# Patient Record
Sex: Male | Born: 1940 | Race: White | Hispanic: No | Marital: Married | Smoking: Current every day smoker
Health system: Southern US, Community
[De-identification: ages and names within clinical notes are randomized; demographics above are authoritative.]

## PROBLEM LIST (undated history)

## (undated) DIAGNOSIS — F419 Anxiety disorder, unspecified: Secondary | ICD-10-CM

## (undated) DIAGNOSIS — I509 Heart failure, unspecified: Secondary | ICD-10-CM

## (undated) DIAGNOSIS — E785 Hyperlipidemia, unspecified: Secondary | ICD-10-CM

## (undated) DIAGNOSIS — I10 Essential (primary) hypertension: Secondary | ICD-10-CM

## (undated) DIAGNOSIS — K219 Gastro-esophageal reflux disease without esophagitis: Secondary | ICD-10-CM

## (undated) DIAGNOSIS — F319 Bipolar disorder, unspecified: Secondary | ICD-10-CM

## (undated) DIAGNOSIS — F0390 Unspecified dementia without behavioral disturbance: Secondary | ICD-10-CM

## (undated) DIAGNOSIS — F32A Depression, unspecified: Secondary | ICD-10-CM

## (undated) DIAGNOSIS — R079 Chest pain, unspecified: Secondary | ICD-10-CM

## (undated) DIAGNOSIS — I739 Peripheral vascular disease, unspecified: Secondary | ICD-10-CM

## (undated) DIAGNOSIS — Z720 Tobacco use: Secondary | ICD-10-CM

## (undated) DIAGNOSIS — F329 Major depressive disorder, single episode, unspecified: Secondary | ICD-10-CM

## (undated) DIAGNOSIS — G20 Parkinson's disease: Secondary | ICD-10-CM

## (undated) HISTORY — PX: CAROTID STENT: SHX1301

---

## 2005-12-02 ENCOUNTER — Emergency Department (HOSPITAL_COMMUNITY): Admission: EM | Admit: 2005-12-02 | Discharge: 2005-12-02 | Payer: Self-pay | Admitting: Emergency Medicine

## 2006-12-26 ENCOUNTER — Emergency Department: Payer: Self-pay | Admitting: Unknown Physician Specialty

## 2016-05-11 ENCOUNTER — Observation Stay (HOSPITAL_COMMUNITY): Payer: Medicare Other

## 2016-05-11 ENCOUNTER — Inpatient Hospital Stay (HOSPITAL_COMMUNITY)
Admission: EM | Admit: 2016-05-11 | Discharge: 2016-05-13 | DRG: 313 | Disposition: A | Discharge status: Home Health | Payer: Medicare Other | Source: Other Acute Inpatient Hospital | Attending: Family Medicine | Admitting: Family Medicine

## 2016-05-11 ENCOUNTER — Observation Stay (HOSPITAL_BASED_OUTPATIENT_CLINIC_OR_DEPARTMENT_OTHER): Payer: Medicare Other

## 2016-05-11 ENCOUNTER — Encounter (HOSPITAL_COMMUNITY): Payer: Self-pay | Admitting: Family Medicine

## 2016-05-11 DIAGNOSIS — E876 Hypokalemia: Secondary | ICD-10-CM | POA: Diagnosis present

## 2016-05-11 DIAGNOSIS — G309 Alzheimer's disease, unspecified: Secondary | ICD-10-CM | POA: Diagnosis present

## 2016-05-11 DIAGNOSIS — F0280 Dementia in other diseases classified elsewhere without behavioral disturbance: Secondary | ICD-10-CM | POA: Diagnosis present

## 2016-05-11 DIAGNOSIS — F17210 Nicotine dependence, cigarettes, uncomplicated: Secondary | ICD-10-CM | POA: Diagnosis present

## 2016-05-11 DIAGNOSIS — F329 Major depressive disorder, single episode, unspecified: Secondary | ICD-10-CM | POA: Diagnosis present

## 2016-05-11 DIAGNOSIS — I509 Heart failure, unspecified: Secondary | ICD-10-CM | POA: Diagnosis present

## 2016-05-11 DIAGNOSIS — F419 Anxiety disorder, unspecified: Secondary | ICD-10-CM | POA: Diagnosis present

## 2016-05-11 DIAGNOSIS — F32A Depression, unspecified: Secondary | ICD-10-CM | POA: Diagnosis present

## 2016-05-11 DIAGNOSIS — F319 Bipolar disorder, unspecified: Secondary | ICD-10-CM | POA: Diagnosis present

## 2016-05-11 DIAGNOSIS — Z7902 Long term (current) use of antithrombotics/antiplatelets: Secondary | ICD-10-CM

## 2016-05-11 DIAGNOSIS — I110 Hypertensive heart disease with heart failure: Secondary | ICD-10-CM | POA: Diagnosis present

## 2016-05-11 DIAGNOSIS — Z888 Allergy status to other drugs, medicaments and biological substances status: Secondary | ICD-10-CM

## 2016-05-11 DIAGNOSIS — I447 Left bundle-branch block, unspecified: Secondary | ICD-10-CM | POA: Diagnosis present

## 2016-05-11 DIAGNOSIS — Z8249 Family history of ischemic heart disease and other diseases of the circulatory system: Secondary | ICD-10-CM

## 2016-05-11 DIAGNOSIS — F0390 Unspecified dementia without behavioral disturbance: Secondary | ICD-10-CM | POA: Diagnosis present

## 2016-05-11 DIAGNOSIS — G20 Parkinson's disease: Secondary | ICD-10-CM | POA: Diagnosis present

## 2016-05-11 DIAGNOSIS — I10 Essential (primary) hypertension: Secondary | ICD-10-CM | POA: Diagnosis present

## 2016-05-11 DIAGNOSIS — I519 Heart disease, unspecified: Secondary | ICD-10-CM | POA: Diagnosis present

## 2016-05-11 DIAGNOSIS — K219 Gastro-esophageal reflux disease without esophagitis: Secondary | ICD-10-CM | POA: Diagnosis present

## 2016-05-11 DIAGNOSIS — J449 Chronic obstructive pulmonary disease, unspecified: Secondary | ICD-10-CM | POA: Diagnosis present

## 2016-05-11 DIAGNOSIS — E785 Hyperlipidemia, unspecified: Secondary | ICD-10-CM | POA: Diagnosis present

## 2016-05-11 DIAGNOSIS — Z91018 Allergy to other foods: Secondary | ICD-10-CM

## 2016-05-11 DIAGNOSIS — R079 Chest pain, unspecified: Principal | ICD-10-CM | POA: Diagnosis present

## 2016-05-11 DIAGNOSIS — Z881 Allergy status to other antibiotic agents status: Secondary | ICD-10-CM

## 2016-05-11 DIAGNOSIS — I739 Peripheral vascular disease, unspecified: Secondary | ICD-10-CM | POA: Diagnosis present

## 2016-05-11 DIAGNOSIS — Z79899 Other long term (current) drug therapy: Secondary | ICD-10-CM

## 2016-05-11 DIAGNOSIS — I472 Ventricular tachycardia: Secondary | ICD-10-CM | POA: Diagnosis present

## 2016-05-11 DIAGNOSIS — I6529 Occlusion and stenosis of unspecified carotid artery: Secondary | ICD-10-CM | POA: Diagnosis present

## 2016-05-11 DIAGNOSIS — Z720 Tobacco use: Secondary | ICD-10-CM | POA: Diagnosis not present

## 2016-05-11 DIAGNOSIS — M6281 Muscle weakness (generalized): Secondary | ICD-10-CM

## 2016-05-11 HISTORY — DX: Major depressive disorder, single episode, unspecified: F32.9

## 2016-05-11 HISTORY — DX: Parkinson's disease: G20

## 2016-05-11 HISTORY — DX: Anxiety disorder, unspecified: F41.9

## 2016-05-11 HISTORY — DX: Chest pain, unspecified: R07.9

## 2016-05-11 HISTORY — DX: Gastro-esophageal reflux disease without esophagitis: K21.9

## 2016-05-11 HISTORY — DX: Heart failure, unspecified: I50.9

## 2016-05-11 HISTORY — DX: Peripheral vascular disease, unspecified: I73.9

## 2016-05-11 HISTORY — DX: Depression, unspecified: F32.A

## 2016-05-11 HISTORY — DX: Hyperlipidemia, unspecified: E78.5

## 2016-05-11 HISTORY — DX: Tobacco use: Z72.0

## 2016-05-11 HISTORY — DX: Bipolar disorder, unspecified: F31.9

## 2016-05-11 HISTORY — DX: Unspecified dementia, unspecified severity, without behavioral disturbance, psychotic disturbance, mood disturbance, and anxiety: F03.90

## 2016-05-11 HISTORY — DX: Essential (primary) hypertension: I10

## 2016-05-11 LAB — COMPREHENSIVE METABOLIC PANEL
ALBUMIN: 3.2 g/dL — AB (ref 3.5–5.0)
ALK PHOS: 45 U/L (ref 38–126)
ALT: 14 U/L — AB (ref 17–63)
AST: 12 U/L — AB (ref 15–41)
Anion gap: 8 (ref 5–15)
BILIRUBIN TOTAL: 0.5 mg/dL (ref 0.3–1.2)
BUN: 16 mg/dL (ref 6–20)
CALCIUM: 8.5 mg/dL — AB (ref 8.9–10.3)
CO2: 31 mmol/L (ref 22–32)
Chloride: 101 mmol/L (ref 101–111)
Creatinine, Ser: 0.82 mg/dL (ref 0.61–1.24)
GFR calc Af Amer: >60 mL/min (ref >60)
GFR calc non Af Amer: >60 mL/min (ref >60)
GLUCOSE: 93 mg/dL (ref 65–99)
Potassium: 3.3 mmol/L — ABNORMAL LOW (ref 3.5–5.1)
SODIUM: 140 mmol/L (ref 135–145)
TOTAL PROTEIN: 5.6 g/dL — AB (ref 6.5–8.1)

## 2016-05-11 LAB — CBC
HEMATOCRIT: 35.7 % — AB (ref 39.0–52.0)
HEMOGLOBIN: 11.8 g/dL — AB (ref 13.0–17.0)
MCH: 32.4 pg (ref 26.0–34.0)
MCHC: 33.1 g/dL (ref 30.0–36.0)
MCV: 98.1 fL (ref 78.0–100.0)
Platelets: 87 10*3/uL — ABNORMAL LOW (ref 150–400)
RBC: 3.64 MIL/uL — AB (ref 4.22–5.81)
RDW: 13 % (ref 11.5–15.5)
WBC: 3.8 10*3/uL — ABNORMAL LOW (ref 4.0–10.5)

## 2016-05-11 LAB — LIPID PANEL
CHOLESTEROL: 118 mg/dL (ref 0–200)
HDL: 42 mg/dL (ref >40)
LDL Cholesterol: 65 mg/dL (ref 0–99)
TRIGLYCERIDES: 57 mg/dL (ref <150)
Total CHOL/HDL Ratio: 2.8 RATIO
VLDL: 11 mg/dL (ref 0–40)

## 2016-05-11 LAB — ECHOCARDIOGRAM COMPLETE

## 2016-05-11 LAB — TROPONIN I
Troponin I: <0.03 ng/mL (ref <0.03)
Troponin I: <0.03 ng/mL (ref <0.03)

## 2016-05-11 LAB — TSH: TSH: 3.843 u[IU]/mL (ref 0.350–4.500)

## 2016-05-11 MED ORDER — IPRATROPIUM-ALBUTEROL 0.5-2.5 (3) MG/3ML IN SOLN
3.0000 mL | Freq: Once | RESPIRATORY_TRACT | Status: AC
  Administered 2016-05-11: 3 mL via RESPIRATORY_TRACT
  Filled 2016-05-11: qty 3

## 2016-05-11 MED ORDER — TIOTROPIUM BROMIDE MONOHYDRATE 18 MCG IN CAPS
18.0000 ug | ORAL_CAPSULE | Freq: Every day | RESPIRATORY_TRACT | Status: DC
  Administered 2016-05-12 – 2016-05-13 (×2): 18 ug via RESPIRATORY_TRACT
  Filled 2016-05-11: qty 5

## 2016-05-11 MED ORDER — CARBIDOPA-LEVODOPA ER 50-200 MG PO TBCR
1.0000 | EXTENDED_RELEASE_TABLET | Freq: Two times a day (BID) | ORAL | Status: DC
  Administered 2016-05-11 – 2016-05-13 (×5): 1 via ORAL
  Filled 2016-05-11 (×11): qty 1

## 2016-05-11 MED ORDER — ACETAMINOPHEN 325 MG PO TABS: 650.0000 mg | ORAL_TABLET | ORAL | Status: DC | PRN

## 2016-05-11 MED ORDER — ROSUVASTATIN CALCIUM 20 MG PO TABS
20.0000 mg | ORAL_TABLET | Freq: Every day | ORAL | Status: DC
  Administered 2016-05-11 – 2016-05-12 (×2): 20 mg via ORAL
  Filled 2016-05-11 (×2): qty 1

## 2016-05-11 MED ORDER — FINASTERIDE 5 MG PO TABS
5.0000 mg | ORAL_TABLET | Freq: Every day | ORAL | Status: DC
  Administered 2016-05-11 – 2016-05-13 (×3): 5 mg via ORAL
  Filled 2016-05-11 (×6): qty 1

## 2016-05-11 MED ORDER — ASPIRIN EC 325 MG PO TBEC
325.0000 mg | DELAYED_RELEASE_TABLET | Freq: Every day | ORAL | Status: DC
  Administered 2016-05-11 – 2016-05-13 (×3): 325 mg via ORAL
  Filled 2016-05-11 (×3): qty 1

## 2016-05-11 MED ORDER — ARFORMOTEROL TARTRATE 15 MCG/2ML IN NEBU
15.0000 ug | INHALATION_SOLUTION | Freq: Two times a day (BID) | RESPIRATORY_TRACT | Status: DC
  Administered 2016-05-12 – 2016-05-13 (×3): 15 ug via RESPIRATORY_TRACT
  Filled 2016-05-11 (×4): qty 2

## 2016-05-11 MED ORDER — POTASSIUM CHLORIDE CRYS ER 20 MEQ PO TBCR
40.0000 meq | EXTENDED_RELEASE_TABLET | Freq: Once | ORAL | Status: AC
  Administered 2016-05-11: 40 meq via ORAL
  Filled 2016-05-11: qty 2

## 2016-05-11 MED ORDER — TAMSULOSIN HCL 0.4 MG PO CAPS
0.4000 mg | ORAL_CAPSULE | Freq: Every day | ORAL | Status: DC
  Administered 2016-05-11 – 2016-05-13 (×3): 0.4 mg via ORAL
  Filled 2016-05-11 (×3): qty 1

## 2016-05-11 MED ORDER — MORPHINE SULFATE (PF) 2 MG/ML IV SOLN: 2.0000 mg | INTRAVENOUS | Status: DC | PRN

## 2016-05-11 MED ORDER — FUROSEMIDE 40 MG PO TABS
40.0000 mg | ORAL_TABLET | Freq: Two times a day (BID) | ORAL | Status: DC
  Administered 2016-05-11 – 2016-05-13 (×4): 40 mg via ORAL
  Filled 2016-05-11 (×4): qty 1

## 2016-05-11 MED ORDER — CLOPIDOGREL BISULFATE 75 MG PO TABS
75.0000 mg | ORAL_TABLET | Freq: Every day | ORAL | Status: DC
  Administered 2016-05-11 – 2016-05-13 (×3): 75 mg via ORAL
  Filled 2016-05-11 (×3): qty 1

## 2016-05-11 MED ORDER — VENLAFAXINE HCL ER 75 MG PO CP24
150.0000 mg | ORAL_CAPSULE | Freq: Every day | ORAL | Status: DC
  Administered 2016-05-11 – 2016-05-13 (×3): 150 mg via ORAL
  Filled 2016-05-11 (×3): qty 2

## 2016-05-11 MED ORDER — POTASSIUM CHLORIDE CRYS ER 20 MEQ PO TBCR
40.0000 meq | EXTENDED_RELEASE_TABLET | Freq: Every day | ORAL | Status: DC
  Administered 2016-05-11 – 2016-05-12 (×2): 40 meq via ORAL
  Filled 2016-05-11 (×2): qty 2

## 2016-05-11 MED ORDER — NITROGLYCERIN 0.3 MG SL SUBL
0.3000 mg | SUBLINGUAL_TABLET | SUBLINGUAL | Status: DC | PRN
  Filled 2016-05-11: qty 100

## 2016-05-11 MED ORDER — NITROGLYCERIN 0.4 MG SL SUBL: 0.4000 mg | SUBLINGUAL_TABLET | SUBLINGUAL | Status: DC | PRN

## 2016-05-11 MED ORDER — METOPROLOL TARTRATE 25 MG PO TABS
12.5000 mg | ORAL_TABLET | Freq: Two times a day (BID) | ORAL | Status: AC
  Administered 2016-05-11 – 2016-05-12 (×4): 12.5 mg via ORAL
  Filled 2016-05-11 (×4): qty 1

## 2016-05-11 MED ORDER — CLONAZEPAM 0.5 MG PO TABS
1.0000 mg | ORAL_TABLET | Freq: Three times a day (TID) | ORAL | Status: DC | PRN
  Administered 2016-05-11 – 2016-05-13 (×4): 1 mg via ORAL
  Filled 2016-05-11 (×4): qty 2

## 2016-05-11 MED ORDER — ONDANSETRON HCL 4 MG/2ML IJ SOLN: 4.0000 mg | Freq: Four times a day (QID) | INTRAMUSCULAR | Status: DC | PRN

## 2016-05-11 MED ORDER — NICOTINE 21 MG/24HR TD PT24
21.0000 mg | MEDICATED_PATCH | Freq: Every day | TRANSDERMAL | Status: DC
  Administered 2016-05-11 – 2016-05-13 (×3): 21 mg via TRANSDERMAL
  Filled 2016-05-11 (×3): qty 1

## 2016-05-11 MED ORDER — SUCRALFATE 1 G PO TABS
1.0000 g | ORAL_TABLET | Freq: Four times a day (QID) | ORAL | Status: DC
  Administered 2016-05-11 – 2016-05-13 (×8): 1 g via ORAL
  Filled 2016-05-11 (×8): qty 1

## 2016-05-11 MED ORDER — HEPARIN SODIUM (PORCINE) 5000 UNIT/ML IJ SOLN: 5000.0000 [IU] | Freq: Three times a day (TID) | INTRAMUSCULAR | Status: DC

## 2016-05-11 MED ORDER — VITAMIN C 500 MG PO TABS
250.0000 mg | ORAL_TABLET | Freq: Every day | ORAL | Status: DC
  Administered 2016-05-11 – 2016-05-13 (×3): 250 mg via ORAL
  Filled 2016-05-11 (×3): qty 1

## 2016-05-11 MED ORDER — GI COCKTAIL ~~LOC~~: 30.0000 mL | Freq: Four times a day (QID) | ORAL | Status: DC | PRN

## 2016-05-11 MED ORDER — GABAPENTIN 400 MG PO CAPS
400.0000 mg | ORAL_CAPSULE | Freq: Every day | ORAL | Status: DC
  Administered 2016-05-11 – 2016-05-12 (×2): 400 mg via ORAL
  Filled 2016-05-11 (×2): qty 1

## 2016-05-11 MED ORDER — FUROSEMIDE 10 MG/ML IJ SOLN
40.0000 mg | Freq: Once | INTRAMUSCULAR | Status: AC
  Administered 2016-05-11: 40 mg via INTRAVENOUS
  Filled 2016-05-11: qty 4

## 2016-05-11 MED ORDER — PANTOPRAZOLE SODIUM 40 MG PO TBEC
40.0000 mg | DELAYED_RELEASE_TABLET | Freq: Every day | ORAL | Status: DC
  Administered 2016-05-11 – 2016-05-13 (×2): 40 mg via ORAL
  Filled 2016-05-11 (×3): qty 1

## 2016-05-11 MED ORDER — OLANZAPINE 5 MG PO TABS
5.0000 mg | ORAL_TABLET | Freq: Every day | ORAL | Status: DC
  Administered 2016-05-11 – 2016-05-12 (×2): 5 mg via ORAL
  Filled 2016-05-11 (×2): qty 1

## 2016-05-11 MED ORDER — ALBUTEROL SULFATE (2.5 MG/3ML) 0.083% IN NEBU
2.5000 mg | INHALATION_SOLUTION | RESPIRATORY_TRACT | Status: DC | PRN
  Administered 2016-05-13: 2.5 mg via RESPIRATORY_TRACT
  Filled 2016-05-11: qty 3

## 2016-05-11 NOTE — Evaluation (Signed)
Physical Therapy Evaluation Patient Details Name: Shane Thornton MRN: [id number] DOB: August 16, 1940 Today's Date: 05/11/2016   History of Present Illness  HPI: Shane Thornton is a 76 y.o. male with known PVD s/p right carotid stent who was sent over from [facility] ER for admission for chest pain.  The patient reports that he has been having chest pain for past several weeks in intermittent bouts of pain and discomforts with some pain 9/10 but that has been rare.  He reports that he was seen a couple of weeks ago at [facility] for similar symptoms and apparently saw a cardiologist at that time but not able to give good history.  He says that he was placed on a cardiac monitor but did not have a stress test or cardiac cath.  Pt has no diaphoresis and no n/v/d.  He had a normal troponin and no acute findings on EKG reported from ED in [place]  Clinical Impression  Pt normally uses a quad cane at his house; states that he does not have room for a rolling walker.  Pt will be ambulated with a quad cane next session.    Follow Up Recommendations Outpatient PT (for balance and improved strength )    Equipment Recommendations  None recommended by PT    Recommendations for Other Services       Precautions / Restrictions Precautions Precautions: None Restrictions Weight Bearing Restrictions: No      Mobility  Bed Mobility Overal bed mobility: Modified Independent                Transfers Overall transfer level: Modified independent                  Ambulation/Gait Ambulation/Gait assistance: Modified independent (Device/Increase time) Ambulation Distance (Feet): 200 Feet Assistive device: Rolling walker (2 wheeled)          Stairs            Wheelchair Mobility    Modified Rankin (Stroke Patients Only)       Balance                                             Pertinent Vitals/Pain Pain Assessment: No/denies pain     Home Living Family/patient expects to be discharged to:: Private residence Living Arrangements: Alone Available Help at Discharge: Family;Available PRN/intermittently Type of Home: House Home Access: Stairs to enter Entrance Stairs-Rails: Right Entrance Stairs-Number of Steps: 3 Home Layout: One level Home Equipment: Cane - quad      Prior Function Level of Independence: Independent with assistive device(s)               Hand Dominance        Extremity/Trunk Assessment        Lower Extremity Assessment Lower Extremity Assessment: Generalized weakness       Communication   Communication: No difficulties  Cognition Arousal/Alertness: Awake/alert Behavior During Therapy: WFL for tasks assessed/performed Overall Cognitive Status: Within Functional Limits for tasks assessed                      General Comments      Exercises General Exercises - Lower Extremity Ankle Circles/Pumps: Both;5 reps Hip ABduction/ADduction: Both;5 reps;Sidelying Straight Leg Raises: Both;5 reps   Assessment/Plan    PT Assessment Patient needs continued PT services  PT Problem List Decreased strength;Decreased activity tolerance          PT Treatment Interventions Gait training;Therapeutic activities;Therapeutic exercise    PT Goals (Current goals can be found in the Care Plan section)  Acute Rehab PT Goals Patient Stated Goal: To go home PT Goal Formulation: With patient Time For Goal Achievement: 05/13/16 Potential to Achieve Goals: Good    Frequency Min 3X/week   Barriers to discharge        Co-evaluation               End of Session Equipment Utilized During Treatment: Gait belt Activity Tolerance: Patient tolerated treatment well Patient left: in bed      Functional Limitation: Mobility: Walking and moving around Mobility: Walking and Moving Around Current Status ([id number](G8978): At least 1 percent but less than 20 percent impaired, limited or  restricted Mobility: Walking and Moving Around Goal Status [phone](G8979): At least 1 percent but less than 20 percent impaired, limited or restricted Mobility: Walking and Moving Around Discharge Status [phone](G8980): At least 1 percent but less than 20 percent impaired, limited or restricted    Time: 1410-1444 PT Time Calculation (min) (ACUTE ONLY): 34 min   Charges:   PT Evaluation $PT Eval Low Complexity: 1 Procedure     PT G Codes:   PT G-Codes **NOT FOR INPATIENT CLASS** Functional Limitation: Mobility: Walking and moving around Mobility: Walking and Moving Around Current Status ([id number](G8978): At least 1 percent but less than 20 percent impaired, limited or restricted Mobility: Walking and Moving Around Goal Status [phone](G8979): At least 1 percent but less than 20 percent impaired, limited or restricted Mobility: Walking and Moving Around Discharge Status [phone](G8980): At least 1 percent but less than 20 percent impaired, limited or restricted    Virgina OrganCynthia Deveon Kisiel, PT CLT [phone] 05/11/2016, 2:44 PM

## 2016-05-11 NOTE — Consult Note (Signed)
Cardiology Consultation   Patient ID: JAHLEEL STROSCHEIN; [id number]; [birth year]   Admit date: 05/11/2016 Date of Consult: 05/11/2016  Referring MD: Dr. Laural Benes Cardiologist: Cardiologist in [place] but he can't remember the name Consulting Cardiologist: Dr. Tenny Craw  No care team member to display    Reason for Consultation: Chest pain   History of Present Illness: Shane Thornton is a 76 y.o. male with a hx of PVD status post right carotid stent who was sent over from [facility] emergency room for admission for chest pain because her hospital was full. Initial troponin was reported to be negative.   He had an admission at [facility] several weeks ago for COPD exacerbation but was also told he had an irregular heartbeat and was monitored. They told him they thought he had blockages but wanted to treat him medically because they didn't think he could hold still for any type of test because of his Parkinson's. He was supposed to follow-up but he refused to.  Most of the history is taken from his daughter who is at the bedside. Since his last hospitalization she says he hasn't done much of anything. His only gotten dressed twice. She took his blood pressure yesterday and it was over 200. He then complained of some chest pain so she took him to the ER. He describes his chest pain as a pressure or heaviness associated with dyspnea. It is worse with exertion but can occur at rest. No radiation or diaphoresis. The worst episode occurred several months ago when he was walking back to  his house after taking care of his hogs. It hurt the whole way back and didn't ease until he laid down. His father died at 57 of an MI, long history of smoking quit 2 or 3 weeks ago, hypertension. No diabetes question cholesterol.   Past Medical History:  Diagnosis Date  . Anxiety disorder   . Bipolar 1 disorder (HCC)   . Chest pain in adult 05/11/2016  . CHF (congestive heart failure) (HCC) 05/11/2016  .  Dementia   . Depression   . Dyslipidemia   . Essential hypertension   . GERD (gastroesophageal reflux disease)   . Parkinson disease (HCC)   . PVD (peripheral vascular disease) (HCC) 05/11/2016  . Tobacco abuse 05/11/2016    Past Surgical History:  Procedure Laterality Date  . CAROTID STENT Right       Home Meds: Prior to Admission medications   Medication Sig Start Date End Date Taking? Authorizing Provider  albuterol (PROVENTIL HFA;VENTOLIN HFA) 108 (90 Base) MCG/ACT inhaler Inhale 2 puffs into the lungs every 4 (four) hours as needed for wheezing or shortness of breath.   Yes Historical Provider, MD  albuterol (PROVENTIL) (2.5 MG/3ML) 0.083% nebulizer solution Take 2.5 mg by nebulization daily as needed for wheezing or shortness of breath.   Yes Historical Provider, MD  arformoterol (BROVANA) 15 MCG/2ML NEBU Take 15 mcg by nebulization 2 (two) times daily.   Yes Historical Provider, MD  carbidopa-levodopa (SINEMET CR) 50-200 MG tablet Take 1 tablet by mouth 2 (two) times daily.   Yes Historical Provider, MD  clonazePAM (KLONOPIN) 1 MG tablet Take 1 mg by mouth 4 (four) times daily.   Yes Historical Provider, MD  clopidogrel (PLAVIX) 75 MG tablet Take 75 mg by mouth daily.   Yes Historical Provider, MD  desvenlafaxine (PRISTIQ) 100 MG 24 hr tablet Take 100 mg by mouth daily.   Yes Historical Provider, MD  esomeprazole (NEXIUM) 40 MG  capsule Take 40 mg by mouth daily at 12 noon.   Yes Historical Provider, MD  finasteride (PROSCAR) 5 MG tablet Take 5 mg by mouth daily.   Yes Historical Provider, MD  furosemide (LASIX) 40 MG tablet Take 40 mg by mouth 2 (two) times daily.   Yes Historical Provider, MD  gabapentin (NEURONTIN) 400 MG capsule Take 400 mg by mouth at bedtime.   Yes Historical Provider, MD  metoprolol tartrate (LOPRESSOR) 25 MG tablet Take 12.5 mg by mouth 2 (two) times daily.   Yes Historical Provider, MD  Multiple Vitamins-Iron (MULTIVITAMIN/IRON PO) Take 1 tablet by mouth  daily.   Yes Historical Provider, MD  nicotine (NICODERM CQ - DOSED IN MG/24 HOURS) 21 mg/24hr patch Place 21 mg onto the skin daily.   Yes Historical Provider, MD  OLANZapine (ZYPREXA) 5 MG tablet Take 5 mg by mouth at bedtime.   Yes Historical Provider, MD  potassium chloride (K-DUR,KLOR-CON) 10 MEQ tablet Take 10 mEq by mouth daily.   Yes Historical Provider, MD  rosuvastatin (CRESTOR) 20 MG tablet Take 20 mg by mouth at bedtime.   Yes Historical Provider, MD  sildenafil (VIAGRA) 25 MG tablet Take 25 mg by mouth daily as needed for erectile dysfunction.   Yes Historical Provider, MD  sucralfate (CARAFATE) 1 g tablet Take 1 g by mouth 4 (four) times daily.   Yes Historical Provider, MD  tamsulosin (FLOMAX) 0.4 MG CAPS capsule Take 0.4 mg by mouth daily.   Yes Historical Provider, MD  tiotropium (SPIRIVA) 18 MCG inhalation capsule Place 18 mcg into inhaler and inhale daily.   Yes Historical Provider, MD  vitamin C (ASCORBIC ACID) 250 MG tablet Take 250 mg by mouth daily.   Yes Historical Provider, MD    Current Medications: . arformoterol  15 mcg Nebulization BID  . aspirin EC  325 mg Oral Daily  . carbidopa-levodopa  1 tablet Oral BID  . clopidogrel  75 mg Oral Daily  . finasteride  5 mg Oral Daily  . furosemide  40 mg Oral BID  . gabapentin  400 mg Oral QHS  . metoprolol tartrate  12.5 mg Oral BID  . nicotine  21 mg Transdermal Daily  . OLANZapine  5 mg Oral QHS  . pantoprazole  40 mg Oral Q0600  . potassium chloride  40 mEq Oral Once  . [START ON 05/12/2016] potassium chloride  40 mEq Oral Daily  . rosuvastatin  20 mg Oral QHS  . sucralfate  1 g Oral QID  . tamsulosin  0.4 mg Oral Daily  . tiotropium  18 mcg Inhalation Daily  . venlafaxine XR  150 mg Oral Q breakfast  . vitamin C  250 mg Oral Daily     Allergies:    Allergies  Allergen Reactions  . Avelox [Moxifloxacin Hcl In Nacl] Other (See Comments)    unknown  . Ciprofloxacin Other (See Comments)    depression  . Horse  Chestnut [Aesculus] Other (See Comments)    Childhood allergy.  . Metronidazole Other (See Comments)    unknown  . Papaya Derivatives Hives    Social History:   The patient  reports that he has been smoking Cigarettes.  He does not have any smokeless tobacco history on file. He reports that he drinks about 3.0 oz of alcohol per week . He reports that he does not use drugs.    Family History:   The patient's family history includes Heart attack in his father.   ROS:  Please see the history of present illness.  Review of Systems  Constitution: Positive for weakness and malaise/fatigue.  Cardiovascular: Positive for chest pain, dyspnea on exertion, irregular heartbeat and leg swelling.  Respiratory: Positive for cough, shortness of breath and wheezing.    All other ROS reviewed and negative.      Vital Signs: Blood pressure (!) 155/57, pulse (!) 57, temperature 98.2 F (36.8 C), temperature source Oral, resp. rate 20, SpO2 98 %.   PHYSICAL EXAM: General:  Well nourished, well developed, in no acute distress  HEENT: normal Lymph: no adenopathy Neck: no JVD Endocrine:  No thryomegaly Vascular: No carotid bruits; FA pulses 2+ bilaterally without bruits  Cardiac:  RRR; normal S1, S2; 2/6 systolic murmur at the left sternal border, no rub, bruit, thrill, or heave Lungs:  Decreased breath sounds with scattered rhonchi  Abd: soft, nontender, no hepatomegaly  Ext: no edema, Good distal pulses bilaterally Musculoskeletal:  No deformities, BUE and BLE strength normal and equal Skin: warm and dry  Neuro:  CNs 2-12 intact, no focal abnormalities noted Psych:  Normal affect    EKG:  Normal sinus rhythm LAFB, poor R-wave progression anteriorly  Telemetry: Sinus bradycardia  Labs: No results for input(s): CKTOTAL, CKMB, TROPONINI in the last 72 hours. Lab Results  Component Value Date   WBC 3.8 (L) 05/11/2016   HGB 11.8 (L) 05/11/2016   HCT 35.7 (L) 05/11/2016   MCV 98.1 05/11/2016     PLT 87 (L) 05/11/2016    Recent Labs Lab 05/11/16 0936  NA 140  K 3.3*  CL 101  CO2 31  BUN 16  CREATININE 0.82  CALCIUM 8.5*  PROT 5.6*  BILITOT 0.5  ALKPHOS 45  ALT 14*  AST 12*  GLUCOSE 93   Lab Results  Component Value Date   CHOL 118 05/11/2016   HDL 42 05/11/2016   LDLCALC 65 05/11/2016   TRIG 57 05/11/2016   No results found for: DDIMER  Radiology/Studies:  Dg Chest 2 View  Result Date: 05/11/2016 CLINICAL DATA:  Chest pain for several weeks.  Right carotid stent. EXAM: CHEST  2 VIEW COMPARISON:  None. FINDINGS: Biapical pleuroparenchymal scarring more prominent on the right but without a masslike or convex inferior appearance. Peripheral right mid lung 10 mm very dense pulmonary nodule, probably a calcified granuloma given the sharp borders. Thoracic spondylosis. Mild bony demineralization. Mild enlargement of the cardiopericardial silhouette. Faint interstitial accentuation bilaterally without overt edema. No pleural effusion. Atherosclerotic calcification of the descending thoracic aorta. IMPRESSION: 1. Mild enlargement of the cardiopericardial silhouette. Faint interstitial accentuation is of uncertain chronicity but could conceivably reflect pulmonary venous hypertension and mild interstitial edema. 2. Suspected old granulomatous disease with a 10 mm calcified nodule peripherally in the right middle lobe. 3. Biapical pleuroparenchymal scarring. 4. Thoracic spondylosis. 5. Atherosclerosis. Electronically Signed   By: Gaylyn Rong M.D.   On: 05/11/2016 10:36     PROBLEM LIST:  Active Problems:   Chest pain in adult   PVD (peripheral vascular disease) (HCC)   CHF (congestive heart failure) (HCC)   Tobacco abuse   Chest pain at rest   Parkinson disease (HCC)   GERD (gastroesophageal reflux disease)   Essential hypertension   Dyslipidemia   Depression   Dementia   Bipolar 1 disorder (HCC)   Anxiety disorder     ASSESSMENT AND PLAN:  Chest pain  worrisome for angina with multiple cardiac risk factors. Initial troponin reportedly negative at [facility] ER. On low-dose metoprolol  12.5 mg twice a day. Patient has sinus bradycardia so cannot titrate. Can proceed with Lexi scan Myoview tomorrow if troponins negative.  Hypertension elevated recently  COPD with recent exacerbation  CHF: No history of this. Await 2-D echo. Given IV Lasix and diuresing  PVD status post carotid stent on Plavix  Parkinson's disease  Bipolar disorder   Signed, Jacolyn ReedyMichele Lenze, PA-C  05/11/2016 12:55 PM   Patient seen and examined  I agree with findings as noted above by Leda GauzeM Lenze  Pt admitted for CP On exam, he is currently comfortable  Lungs with mild wheeze  Cardiac exam:  RRR  NO S3  Ext without edema I have reviewed echo he had done today  LVEF is 40 to 45% with hypokinesis of the inferior/inferoseptal walls   I discussed findingas with pt  Based on above I would recomm L heart cath the define anatomy  The patient says he is not ready for this, not sure he wants this I have also been able to talk to daughter Shane Thornton on the phone  Told her about recommendations  She will review with Dr Wyline MoodBranch and with Pt tomorrow. Would keep on current regimen for now.    Dietrich PatesPaula Peola Thornton

## 2016-05-11 NOTE — Progress Notes (Signed)
*  PRELIMINARY RESULTS* Echocardiogram 2D Echocardiogram has been performed.  Jeryl Columbialliott, Christee Mervine 05/11/2016, 11:28 AM

## 2016-05-11 NOTE — Care Management Note (Signed)
Case Management Note  Patient Details  Name: Delila SpenceHoward C Borrayo MRN: [id number] Date of Birth: 11/07/1940  Subjective/Objective:                  Patient adm from home with CP. He lives alone but daughter and son  both live in neighboring houses. He walks with a cane, but states he is unsteady and has lots of pain in feet. PT consult pending. He has home oxygen, he wears at night provided  By [employer]. He has a neb machine also.   Action/Plan: CM to follow for needs. If needs HH, patient want to use [facility].   Expected Discharge Date:    05/11/2016          Expected Discharge Plan:  Home w Home Health Services  In-House Referral:  NA  Discharge planning Services  CM Consult  Post Acute Care Choice:  Home Health Choice offered to:  Patient, Adult Children  DME Arranged:    DME Agency:     HH Arranged:    HH Agency:     Status of Service:  In process, will continue to follow  If discussed at Long Length of Stay Meetings, dates discussed:    Additional Comments:  Isabela Nardelli, Chrystine OilerSharley Diane, RN 05/11/2016, 11:09 AM

## 2016-05-11 NOTE — H&P (Signed)
History and Physical  Shane SpenceHoward C Thornton ZOX:[id number] DOB: 05/26/1940 DOA: 05/11/2016  PCP: Harland DingwallSettle  Chief Complaint: chest pain  HPI: Shane SpenceHoward C Thornton is a 76 y.o. male with known PVD s/p right carotid stent who was [facility] for [facility] for chest pain.  The patient reports that he has been having chest pain for past several weeks in intermittent bouts of pain and discomforts with some pain 9/10 but that has been rare.  He reports that he was seen a couple of weeks ago [facility] for similar symptoms and apparently saw a cardiologist at that time but not able to give good history.  He says that he was placed on a cardiac monitor but did not have a stress test or cardiac cath.  Pt has no diaphoresis and no n/v/d.  He had a normal troponin and no acute findings on EKG reported from ED in [place].    Review of Systems: All systems reviewed and apart from history of presenting illness, are negative.  Past Medical History:  Diagnosis Date  . Chest pain in adult 05/11/2016  . CHF (congestive heart failure) (HCC) 05/11/2016  . PVD (peripheral vascular disease) (HCC) 05/11/2016  . Tobacco abuse 05/11/2016   Past Surgical History:  Procedure Laterality Date  . CAROTID STENT Right    Social History:  reports that he has been smoking Cigarettes.  He does not have any smokeless tobacco history on file. He reports that he drinks about 3.0 oz of alcohol per week . He reports that he does not use drugs.  Family History: Denies history of premature cardiac death, heart attack, stroke.   Prior to Admission medications   Not on File   Physical Exam: Vitals: SEE EMR    General exam: Moderately built and nourished patient, lying comfortably in no obvious distress.  Head, eyes and ENT: Nontraumatic and normocephalic. Pupils equally reacting to light and accommodation. Oral mucosa dry.  Neck: Supple. No JVD, carotid bruit or thyromegaly.  Lymphatics: No  lymphadenopathy.  Respiratory system: Clear to auscultation. No increased work of breathing.  Cardiovascular system: S1 and S2 heard, RRR. No JVD, murmurs, gallops, clicks or pedal edema.  Gastrointestinal system: Abdomen is nondistended, soft and nontender. Normal bowel sounds heard. No organomegaly or masses appreciated.  Central nervous system: Alert and oriented. No focal neurological deficits.  Extremities: Symmetric 5 x 5 power. Peripheral pulses symmetrically felt.   Skin: No rashes or acute findings.  Musculoskeletal system: trace bilateral pretibial edema.  Psychiatry: Pleasant and cooperative.  Labs on Admission:  Basic Metabolic Panel: No results for input(s): NA, K, CL, CO2, GLUCOSE, BUN, CREATININE, CALCIUM, MG, PHOS in the last 168 hours. Liver Function Tests: No results for input(s): AST, ALT, ALKPHOS, BILITOT, PROT, ALBUMIN in the last 168 hours. No results for input(s): LIPASE, AMYLASE in the last 168 hours. No results for input(s): AMMONIA in the last 168 hours. CBC: No results for input(s): WBC, NEUTROABS, HGB, HCT, MCV, PLT in the last 168 hours. Cardiac Enzymes: No results for input(s): CKTOTAL, CKMB, CKMBINDEX, TROPONINI in the last 168 hours.  BNP (last 3 results) No results for input(s): PROBNP in the last 8760 hours. CBG: No results for input(s): GLUCAP in the last 168 hours.  Radiological Exams on Admission: No results found.  EKG: Independently reviewed.  Assessment/Plan Active Problems:   Chest pain in adult   PVD (peripheral vascular disease) (HCC)   CHF (congestive heart failure) (HCC)  Tobacco abuse   Chest pain at rest  Atypical presentation of chest pain in patient with risk factors and poor historian - admit for observation, monitor on telemetry, cycle troponin I, check lipids. Check Echo with reported history of CHF, ordered nuclear stress test. Counseled on tobacco cessation, repeat EKG for any recurrent chest pain, check vitals  regularly, ask pharmacy to reconcile home medications, morphine, NTG, aspirin and oxygen as needed ordered.   Pending further work up get cardiology involved as needed.    DVT Prophylaxis: heparin Code Status: full  Family Communication: daughter   Disposition Plan: Home    Time spent: 62 mins  Standley Dakins, MD [facility] Pager [phone]  If 7PM-7AM, please contact night-coverage [email or web address] Password [email or web address] 05/11/2016, 9:16 AM

## 2016-05-11 NOTE — Care Management Obs Status (Signed)
MEDICARE OBSERVATION STATUS NOTIFICATION   Patient Details  Name: Shane SpenceHoward C Voisin MRN: [id number] Date of Birth: 03/03/1941   Medicare Observation Status Notification Given:  Yes    Caelen Higinbotham, Chrystine OilerSharley Diane, RN 05/11/2016, 11:14 AM

## 2016-05-12 ENCOUNTER — Observation Stay (HOSPITAL_BASED_OUTPATIENT_CLINIC_OR_DEPARTMENT_OTHER): Payer: Medicare Other

## 2016-05-12 ENCOUNTER — Encounter (HOSPITAL_COMMUNITY): Payer: Self-pay

## 2016-05-12 DIAGNOSIS — F0280 Dementia in other diseases classified elsewhere without behavioral disturbance: Secondary | ICD-10-CM | POA: Diagnosis present

## 2016-05-12 DIAGNOSIS — I509 Heart failure, unspecified: Secondary | ICD-10-CM | POA: Diagnosis present

## 2016-05-12 DIAGNOSIS — F319 Bipolar disorder, unspecified: Secondary | ICD-10-CM | POA: Diagnosis present

## 2016-05-12 DIAGNOSIS — I447 Left bundle-branch block, unspecified: Secondary | ICD-10-CM | POA: Diagnosis present

## 2016-05-12 DIAGNOSIS — J449 Chronic obstructive pulmonary disease, unspecified: Secondary | ICD-10-CM | POA: Diagnosis present

## 2016-05-12 DIAGNOSIS — R079 Chest pain, unspecified: Secondary | ICD-10-CM

## 2016-05-12 DIAGNOSIS — F17210 Nicotine dependence, cigarettes, uncomplicated: Secondary | ICD-10-CM | POA: Diagnosis present

## 2016-05-12 DIAGNOSIS — R0789 Other chest pain: Secondary | ICD-10-CM | POA: Diagnosis not present

## 2016-05-12 DIAGNOSIS — G20 Parkinson's disease: Secondary | ICD-10-CM | POA: Diagnosis present

## 2016-05-12 DIAGNOSIS — Z7902 Long term (current) use of antithrombotics/antiplatelets: Secondary | ICD-10-CM | POA: Diagnosis not present

## 2016-05-12 DIAGNOSIS — E785 Hyperlipidemia, unspecified: Secondary | ICD-10-CM

## 2016-05-12 DIAGNOSIS — I739 Peripheral vascular disease, unspecified: Secondary | ICD-10-CM | POA: Diagnosis present

## 2016-05-12 DIAGNOSIS — I472 Ventricular tachycardia: Secondary | ICD-10-CM | POA: Diagnosis present

## 2016-05-12 DIAGNOSIS — I5022 Chronic systolic (congestive) heart failure: Secondary | ICD-10-CM | POA: Diagnosis not present

## 2016-05-12 DIAGNOSIS — Z8249 Family history of ischemic heart disease and other diseases of the circulatory system: Secondary | ICD-10-CM | POA: Diagnosis not present

## 2016-05-12 DIAGNOSIS — I6529 Occlusion and stenosis of unspecified carotid artery: Secondary | ICD-10-CM | POA: Diagnosis present

## 2016-05-12 DIAGNOSIS — E876 Hypokalemia: Secondary | ICD-10-CM | POA: Diagnosis present

## 2016-05-12 DIAGNOSIS — I519 Heart disease, unspecified: Secondary | ICD-10-CM | POA: Diagnosis present

## 2016-05-12 DIAGNOSIS — Z79899 Other long term (current) drug therapy: Secondary | ICD-10-CM | POA: Diagnosis not present

## 2016-05-12 DIAGNOSIS — F419 Anxiety disorder, unspecified: Secondary | ICD-10-CM | POA: Diagnosis present

## 2016-05-12 DIAGNOSIS — Z91018 Allergy to other foods: Secondary | ICD-10-CM | POA: Diagnosis not present

## 2016-05-12 DIAGNOSIS — K219 Gastro-esophageal reflux disease without esophagitis: Secondary | ICD-10-CM

## 2016-05-12 DIAGNOSIS — Z888 Allergy status to other drugs, medicaments and biological substances status: Secondary | ICD-10-CM | POA: Diagnosis not present

## 2016-05-12 DIAGNOSIS — Z881 Allergy status to other antibiotic agents status: Secondary | ICD-10-CM | POA: Diagnosis not present

## 2016-05-12 DIAGNOSIS — I110 Hypertensive heart disease with heart failure: Secondary | ICD-10-CM | POA: Diagnosis present

## 2016-05-12 DIAGNOSIS — G309 Alzheimer's disease, unspecified: Secondary | ICD-10-CM | POA: Diagnosis present

## 2016-05-12 DIAGNOSIS — I10 Essential (primary) hypertension: Secondary | ICD-10-CM | POA: Diagnosis not present

## 2016-05-12 LAB — BASIC METABOLIC PANEL
Anion gap: 6 (ref 5–15)
BUN: 16 mg/dL (ref 6–20)
CALCIUM: 8.9 mg/dL (ref 8.9–10.3)
CHLORIDE: 104 mmol/L (ref 101–111)
CO2: 30 mmol/L (ref 22–32)
CREATININE: 0.91 mg/dL (ref 0.61–1.24)
GFR calc non Af Amer: >60 mL/min (ref >60)
GLUCOSE: 104 mg/dL — AB (ref 65–99)
Potassium: 3.1 mmol/L — ABNORMAL LOW (ref 3.5–5.1)
Sodium: 140 mmol/L (ref 135–145)

## 2016-05-12 LAB — NM MYOCAR MULTI W/SPECT W/WALL MOTION / EF
CHL CUP NUCLEAR SDS: 2
CHL CUP NUCLEAR SSS: 8
CSEPPHR: 76 {beats}/min
LHR: 0.1
LV dias vol: 130 mL (ref 62–150)
LV sys vol: 73 mL
Rest HR: 60 {beats}/min
SRS: 6
TID: 1.06

## 2016-05-12 LAB — HEMOGLOBIN A1C
HEMOGLOBIN A1C: 5.5 % (ref 4.8–5.6)
Mean Plasma Glucose: 111 mg/dL

## 2016-05-12 LAB — MAGNESIUM: Magnesium: 1.8 mg/dL (ref 1.7–2.4)

## 2016-05-12 MED ORDER — POTASSIUM CHLORIDE CRYS ER 20 MEQ PO TBCR
40.0000 meq | EXTENDED_RELEASE_TABLET | Freq: Two times a day (BID) | ORAL | Status: DC
  Administered 2016-05-12 – 2016-05-13 (×2): 40 meq via ORAL
  Filled 2016-05-12 (×2): qty 2

## 2016-05-12 MED ORDER — POTASSIUM CHLORIDE CRYS ER 20 MEQ PO TBCR
20.0000 meq | EXTENDED_RELEASE_TABLET | Freq: Once | ORAL | Status: AC
  Administered 2016-05-12: 20 meq via ORAL
  Filled 2016-05-12: qty 1

## 2016-05-12 MED ORDER — REGADENOSON 0.4 MG/5ML IV SOLN
0.4000 mg | Freq: Once | INTRAVENOUS | Status: AC
  Administered 2016-05-12: 0.4 mg via INTRAVENOUS
  Filled 2016-05-12: qty 5

## 2016-05-12 MED ORDER — ISOSORBIDE MONONITRATE ER 30 MG PO TB24
15.0000 mg | ORAL_TABLET | Freq: Every day | ORAL | Status: DC
  Administered 2016-05-12 – 2016-05-13 (×2): 15 mg via ORAL
  Filled 2016-05-12 (×2): qty 1

## 2016-05-12 MED ORDER — METOPROLOL SUCCINATE ER 25 MG PO TB24
12.5000 mg | ORAL_TABLET | Freq: Every day | ORAL | Status: DC
  Administered 2016-05-13: 12.5 mg via ORAL
  Filled 2016-05-12: qty 1

## 2016-05-12 MED ORDER — TECHNETIUM TC 99M TETROFOSMIN IV KIT
30.0000 | PACK | Freq: Once | INTRAVENOUS | Status: AC | PRN
  Administered 2016-05-12: 29.7 via INTRAVENOUS

## 2016-05-12 MED ORDER — REGADENOSON 0.4 MG/5ML IV SOLN
INTRAVENOUS | Status: AC
  Administered 2016-05-12: 0.4 mg via INTRAVENOUS
  Filled 2016-05-12: qty 5

## 2016-05-12 MED ORDER — TECHNETIUM TC 99M TETROFOSMIN IV KIT
10.0000 | PACK | Freq: Once | INTRAVENOUS | Status: AC | PRN
Start: 2016-05-12 — End: 2016-05-12
  Administered 2016-05-12: 10 via INTRAVENOUS

## 2016-05-12 MED ORDER — SODIUM CHLORIDE 0.9% FLUSH
INTRAVENOUS | Status: AC
  Administered 2016-05-12: 10 mL via INTRAVENOUS
  Filled 2016-05-12: qty 10

## 2016-05-12 MED ORDER — LISINOPRIL 5 MG PO TABS
2.5000 mg | ORAL_TABLET | Freq: Every day | ORAL | Status: DC
  Administered 2016-05-12 – 2016-05-13 (×2): 2.5 mg via ORAL
  Filled 2016-05-12 (×2): qty 1

## 2016-05-12 MED ORDER — SODIUM CHLORIDE 0.9% FLUSH
INTRAVENOUS | Status: AC
  Filled 2016-05-12: qty 150

## 2016-05-12 NOTE — Progress Notes (Signed)
Patient Name: Shane Thornton C Slauson Date of Encounter: 05/12/2016  [facility]  [facility] Problem List     Active Problems:   Chest pain in adult   PVD (peripheral vascular disease) (HCC)   CHF (congestive heart failure) (HCC)   Tobacco abuse   Chest pain at rest   Parkinson disease (HCC)   GERD (gastroesophageal reflux disease)   Essential hypertension   Dyslipidemia   Depression   Dementia   Bipolar 1 disorder (HCC)   Anxiety disorder     Subjective   Some chest discomfort overnight. Vague description. Patient examined in the stress lab prior to Lexiscan stress test. Procedure explained to daughter by NM prior to test. Agreed to proceed.   Inpatient Medications    Scheduled Meds: . sodium chloride flush      . arformoterol  15 mcg Nebulization BID  . aspirin EC  325 mg Oral Daily  . carbidopa-levodopa  1 tablet Oral BID  . clopidogrel  75 mg Oral Daily  . finasteride  5 mg Oral Daily  . furosemide  40 mg Oral BID  . gabapentin  400 mg Oral QHS  . metoprolol tartrate  12.5 mg Oral BID  . nicotine  21 mg Transdermal Daily  . OLANZapine  5 mg Oral QHS  . pantoprazole  40 mg Oral Q0600  . potassium chloride  40 mEq Oral Daily  . rosuvastatin  20 mg Oral QHS  . sucralfate  1 g Oral QID  . tamsulosin  0.4 mg Oral Daily  . tiotropium  18 mcg Inhalation Daily  . venlafaxine XR  150 mg Oral Q breakfast  . vitamin C  250 mg Oral Daily   Continuous Infusions:  PRN Meds: acetaminophen, albuterol, clonazePAM, gi cocktail, morphine injection, nitroGLYCERIN, ondansetron (ZOFRAN) IV, technetium tetrofosmin   Vital Signs    Vitals:   05/11/16 2203 05/12/16 0640 05/12/16 0802 05/12/16 0813  BP: 125/89 (!) 137/53    Pulse: 66 (!) 57    Resp: 20 20    Temp: 97.8 F (36.6 C) 97.4 F (36.3 C)    TempSrc: Oral Oral    SpO2: 94% 95% 93% 98%  Height:        Intake/Output Summary (Last 24 hours) at 05/12/16 1007 Last data filed at 05/12/16 0641  Gross per  24 hour  Intake              480 ml  Output             2700 ml  Net            -2220 ml   There were no vitals filed for this visit.  Physical Exam    GEN: Well nourished, well developed, in no acute distress.  HEENT: Grossly normal.  Neck: Supple, no JVD, carotid bruits, or masses. Cardiac: RRR, occasional irregular systole, 1/6 systolic murmurs, rubs, or gallops. No clubbing, cyanosis, edema.  Radials/DP/PT 2+ and equal bilaterally.  Respiratory:  Mild crackles bilaterally, no wheezes or rhonchi. Non-labored breathing.  GI: Soft, nontender, nondistended, BS + x 4. MS: no deformity or atrophy. Skin: warm and dry, no rash. Neuro:  Strength and sensation are intact. Psych: AAOx3.  Flat affect.   Labs    CBC  Recent Labs  05/11/16 0936  WBC 3.8*  HGB 11.8*  HCT 35.7*  MCV 98.1  PLT 87*   Basic Metabolic Panel  Recent Labs  05/11/16 0936  NA 140  K 3.3*  CL  101  CO2 31  GLUCOSE 93  BUN 16  CREATININE 0.82  CALCIUM 8.5*   Liver Function Tests  Recent Labs  05/11/16 0936  AST 12*  ALT 14*  ALKPHOS 45  BILITOT 0.5  PROT 5.6*  ALBUMIN 3.2*   Cardiac Enzymes  Recent Labs  05/11/16 1600 05/11/16 2118  TROPONINI <0.03 <0.03   Hemoglobin A1C  Recent Labs  05/11/16 0936  HGBA1C 5.5   Fasting Lipid Panel  Recent Labs  05/11/16 0936  CHOL 118  HDL 42  LDLCALC 65  TRIG 57  CHOLHDL 2.8   Thyroid Function Tests  Recent Labs  05/11/16 0936  TSH 3.843    Telemetry    LBBB, sinus bradycardia- Personally Reviewed    Radiology    Dg Chest 2 View  Result Date: 05/11/2016 CLINICAL DATA:  Chest pain for several weeks.  Right carotid stent. EXAM: CHEST  2 VIEW COMPARISON:  None. FINDINGS: Biapical pleuroparenchymal scarring more prominent on the right but without a masslike or convex inferior appearance. Peripheral right mid lung 10 mm very dense pulmonary nodule, probably a calcified granuloma given the sharp borders. Thoracic spondylosis.  Mild bony demineralization. Mild enlargement of the cardiopericardial silhouette. Faint interstitial accentuation bilaterally without overt edema. No pleural effusion. Atherosclerotic calcification of the descending thoracic aorta. IMPRESSION: 1. Mild enlargement of the cardiopericardial silhouette. Faint interstitial accentuation is of uncertain chronicity but could conceivably reflect pulmonary venous hypertension and mild interstitial edema. 2. Suspected old granulomatous disease with a 10 mm calcified nodule peripherally in the right middle lobe. 3. Biapical pleuroparenchymal scarring. 4. Thoracic spondylosis. 5. Atherosclerosis. Electronically Signed   By: Gaylyn Rong M.D.   On: 05/11/2016 10:36    Cardiac Studies   Echocardiogram 05/11/2016 Study Conclusions  - Left ventricle: LVEF is approximately 40 to 45% with hypokinesis   of the inferior/inferoseptal walls . The cavity size was   moderately dilated. Wall thickness was increased in a pattern of   mild LVH. Doppler parameters are consistent with abnormal left   ventricular relaxation (grade 1 diastolic dysfunction). Doppler   parameters are consistent with high ventricular filling pressure. - Mitral valve: There was mild regurgitation. - Left atrium: The atrium was mildly dilated. - Right ventricle: Systolic function was mildly reduced. - Right atrium: The atrium was mildly dilated. - Pulmonary arteries: PA peak pressure: 39 mm Hg (S).  Patient Profile      76 y.o. male with a hx of PVD status post right carotid stent who was sent over from [facility] emergency room for admission for chest pain, recent treatment for COPD exacerbation at [facility]. He has hx of Bipolar disorder and is a poor historian. Daughter provides information and consent.  Assessment & Plan    1. Chest Pain: Troponin negative X 3. Lexiscan stress test this am. Scintigraphy is pending. He continues to have complaints of chest  pressure. EKG LBBB with bradycardia at baseline prior to stress test. Continue metoprolol. Dr. Tenny Craw discussed possible cardiac cath with patient. He refuses at this time.   2. Carotid Artery Disease: On DAPT with ASA and Plavix.   3. Systolic Dysfunction: EF of 40-45%. Uncertain if this is new finding. Ischemic work up is underway. Consider adding low dose ACE. Creatinine 0.82. BP is stable. Has been given IV lasix. Diuresed 2.2 liters. No significant edema. Some crackles noted but may be from COPD. No wheezing or coughing.   4. COPD: Management per PCP.  Signed, Joni Reining, NP  05/12/2016, 10:07 AM   Patient seen and discussed with NP Lyman Bishop, I agree with her documenation above. Admitted with chest pain. Troponins neg x 2, EKG with LBBB of unknown chronicity. Echo LVEF 40-45%, hypokinesis inferior/inferoseptal walls. Nuclear stress suggests possible prior inferoapical infarct, no significant current ischemia. At this time would recommend medical therapy, would not pursue cath. From Dr Tenny Craw' note she discussed possible cath and patient was not in favor. If refractory symptoms could consider cath. Borderline low heart rates, will not titrate beta blocker. Will start low dose imdur 15mg  as additional antianginal. In setting of PAD and systolic dysfunction, start low dose lisionrpil 2.5mg . Change lopressor to Toprol XL 12.5mg  daily starting tomrrow. Patient with some mild chest pain today, follow symptoms today, possible discharge tomorrow.    Dominga Ferry MD

## 2016-05-12 NOTE — Progress Notes (Signed)
At 2109 CCMD called for the second time about pt running vtach/vfib on the telemetry monitor. Upon assessment pt denies any chest pain or discomfort. Vital signs were stable and MD made aware.

## 2016-05-12 NOTE — Evaluation (Signed)
Occupational Therapy Evaluation Patient Details Name: Shane Thornton MRN: [id number] DOB: 09/13/1940 Today's Date: 05/12/2016    History of Present Illness HPI: Shane Thornton is a 76 y.o. male with known PVD s/p right carotid stent who was [facility] for [facility] for chest pain.  The patient reports that he has been having chest pain for past several weeks in intermittent bouts of pain and discomforts with some pain 9/10 but that has been rare.  He reports that he was seen a couple of weeks ago [facility] for similar symptoms and apparently saw a cardiologist at that time but not able to give good history.  He says that he was placed on a cardiac monitor but did not have a stress test or cardiac cath.  Pt has no diaphoresis and no n/v/d.  He had a normal troponin and no acute findings on EKG [place]   Clinical Impression   Pt awake, alert, oriented x4 this am, agreeable to OT evaluation. Pt daughter reports he has not been up much at home and has not been completing ADLs over past week since admission to [facility]. Pt demonstrates baseline functioning with ADL completion, supervision for standing tasks. Pt daughter reports she has noticed a significant progression of Parkinson's symptoms over the past week of limited mobility at home. Discussed various AE to assist in feeding tasks due to pt tremors. Recommend outpatient OT services with emphasis on Parkinson's recovery program ([facility] or alternative neuro-based program). Provided pt and daughter with information on adaptive utensils. No further acute OT services required at this time.     Follow Up Recommendations  Outpatient OT    Equipment Recommendations  None recommended by OT       Precautions / Restrictions Precautions Precautions: None Restrictions Weight Bearing Restrictions: No      Mobility Bed Mobility Overal bed mobility:  Modified Independent                Transfers Overall transfer level: Modified independent                         ADL Overall ADL's : Needs assistance/impaired     Grooming: Wash/dry hands;Oral care;Supervision/safety;Standing               Lower Body Dressing: Modified independent;Sitting/lateral leans   Toilet Transfer: Modified Independent   Toileting- Clothing Manipulation and Hygiene: Modified independent;Sit to/from stand       Functional mobility during ADLs: Supervision/safety;Cane       Vision Vision Assessment?: No apparent visual deficits          Pertinent Vitals/Pain Pain Assessment: No/denies pain     Hand Dominance Right   Extremity/Trunk Assessment Upper Extremity Assessment Upper Extremity Assessment: Overall WFL for tasks assessed   Lower Extremity Assessment Lower Extremity Assessment: Defer to PT evaluation       Communication Communication Communication: No difficulties   Cognition Arousal/Alertness: Awake/alert Behavior During Therapy: WFL for tasks assessed/performed Overall Cognitive Status: Within Functional Limits for tasks assessed                                Home Living Family/patient expects to be discharged to:: Private residence Living Arrangements: Alone Available Help at Discharge: Family;Available PRN/intermittently Type of Home: House  Bathroom Shower/Tub: Tub/shower unit;Walk-in Human resources officer: Standard     Home Equipment: Cane - quad;Shower seat          Prior Functioning/Environment Level of Independence: Independent with assistive device(s)        Comments: Quad cane during ambulation        OT Problem List: Decreased activity tolerance;Decreased coordination;Decreased strength    End of Session Equipment Utilized During Treatment: Gait belt (quad cane)  Activity Tolerance: Patient tolerated treatment well Patient left: in bed;with  call bell/phone within reach;with family/visitor present   Time: 4540-9811 OT Time Calculation (min): 26 min Charges:  OT General Charges $OT Visit: 1 Procedure OT Evaluation $OT Eval Moderate Complexity: 1 Procedure G-Codes: OT G-codes **NOT FOR INPATIENT CLASS** Functional Assessment Tool Used: clinical judgement Functional Limitation: Self care Self Care Current Status (B1478): At least 20 percent but less than 40 percent impaired, limited or restricted Self Care Goal Status (G9562): At least 20 percent but less than 40 percent impaired, limited or restricted Self Care Discharge Status [phone]): At least 20 percent but less than 40 percent impaired, limited or restricted   Ezra Sites, OTR/L  [phone] 05/12/2016, 11:24 AM

## 2016-05-12 NOTE — Progress Notes (Addendum)
PROGRESS NOTE    Shane Thornton  ZOX:[id number]  DOB: 08/05/1940  DOA: 05/11/2016 PCP: No primary care provider on file. Outpatient Specialists:   Hospital course: Shane Thornton is a 76 y.o. male with known PVD s/p right carotid stent who was sent [facility] for [facility] for chest pain.  The patient reports that he has been having chest pain for past several weeks in intermittent bouts of pain and discomforts with some pain 9/10 but that has been rare.  He reports that he was seen a couple of weeks ago [facility] for similar [facility] and apparently saw a cardiologist at that time but not able to give good history.  He says that he was placed on a cardiac monitor but did not have a stress test or cardiac cath.  Pt has no diaphoresis and no n/v/d.  He had a normal troponin and no acute findings on EKG reported from [place].    Assessment & Plan:   1. Chest pain - still having some chest pain, Troponin has been negative.  EKG with LBBB of unknown chronicity. Echo LVEF 40-45%, hypokinesis inferior/inferoseptal walls. Nuclear stress suggests possible prior inferoapical infarct, no significant current ischemia. Cardiology is recommending medical management.  Medication adjustments being made.  Cardiology added imdur, lisinopril and toprol XL.  Will need to monitor with these changes.  Not medically ready for discharge today.   2. PVD - continue plavix and aspirin for now.  3. Tobacco Abuse - counseled at bedside on cessation.   4. CHF -stable, compensated. 5. Hypokalemia - replace orally, recheck BMP in AM.   DVT prophylaxis: heparin Code Status: full Family Communication: daughter Disposition Plan: Home with outpatient PT/OT recommended  Consultants:  cardiology  Procedures:  Stress test  Subjective: Pt having some chest discomfort but overall some improvement from admission  Objective: Vitals:   05/12/16 0640 05/12/16 0802 05/12/16  0813 05/12/16 1221  BP: (!) 137/53   (!) 137/49  Pulse: (!) 57   65  Resp: 20     Temp: 97.4 F (36.3 C)     TempSrc: Oral     SpO2: 95% 93% 98%   Height:        Intake/Output Summary (Last 24 hours) at 05/12/16 1425 Last data filed at 05/12/16 0641  Gross per 24 hour  Intake              480 ml  Output             1675 ml  Net            -1195 ml   There were no vitals filed for this visit.  Exam:  GEN: Well nourished, well developed, in no acute distress.  HEENT: Grossly normal.  Neck: Supple, no JVD, carotid bruits, or masses. Cardiac: RRR, occasional irregular systole, No clubbing, cyanosis, edema.    Respiratory:  Mild crackles bilaterally, no wheezes or rhonchi. Non-labored breathing.  GI: Soft, nontender, nondistended, BS + x 4. MS: no deformity or atrophy. Skin: warm and dry, no rash. Neuro:  Strength and sensation are intact. Psych: AAOx3.  Flat affect.   Data Reviewed: Basic Metabolic Panel:  Recent Labs Lab 05/11/16 0936 05/12/16 1016  NA 140 140  K 3.3* 3.1*  CL 101 104  CO2 31 30  GLUCOSE 93 104*  BUN 16 16  CREATININE 0.82 0.91  CALCIUM 8.5* 8.9   Liver Function Tests:  Recent Labs Lab  05/11/16 0936  AST 12*  ALT 14*  ALKPHOS 45  BILITOT 0.5  PROT 5.6*  ALBUMIN 3.2*   No results for input(s): LIPASE, AMYLASE in the last 168 hours. No results for input(s): AMMONIA in the last 168 hours. CBC:  Recent Labs Lab 05/11/16 0936  WBC 3.8*  HGB 11.8*  HCT 35.7*  MCV 98.1  PLT 87*   Cardiac Enzymes:  Recent Labs Lab 05/11/16 1600 05/11/16 2118  TROPONINI <0.03 <0.03   CBG (last 3)  No results for input(s): GLUCAP in the last 72 hours. No results found for this or any previous visit (from the past 240 hour(s)).   Studies: Nm Myocar Multi W/spect W/wall Motion / Ef  Result Date: 05/12/2016  There was no ST segment deviation noted during stress.  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  This is an intermediate risk study. Intermediate risk based on mildly decreased LVEF and prior infarct. There is no significant current ischemia.Consider correlating findings with echo.  The left ventricular ejection fraction is mildly decreased (45-54%).    Dg Chest 2 View  Result Date: 05/11/2016 CLINICAL DATA:  Chest pain for several weeks.  Right carotid stent. EXAM: CHEST  2 VIEW COMPARISON:  None. FINDINGS: Biapical pleuroparenchymal scarring more prominent on the right but without a masslike or convex inferior appearance. Peripheral right mid lung 10 mm very dense pulmonary nodule, probably a calcified granuloma given the sharp borders. Thoracic spondylosis. Mild bony demineralization. Mild enlargement of the cardiopericardial silhouette. Faint interstitial accentuation bilaterally without overt edema. No pleural effusion. Atherosclerotic calcification of the descending thoracic aorta. IMPRESSION: 1. Mild enlargement of the cardiopericardial silhouette. Faint interstitial accentuation is of uncertain chronicity but could conceivably reflect pulmonary venous hypertension and mild interstitial edema. 2. Suspected old granulomatous disease with a 10 mm calcified nodule peripherally in the right middle lobe. 3. Biapical pleuroparenchymal scarring. 4. Thoracic spondylosis. 5. Atherosclerosis. Electronically Signed   By: Gaylyn Rong M.D.   On: 05/11/2016 10:36   Scheduled Meds: . arformoterol  15 mcg Nebulization BID  . aspirin EC  325 mg Oral Daily  . carbidopa-levodopa  1 tablet Oral BID  . clopidogrel  75 mg Oral Daily  . finasteride  5 mg Oral Daily  . furosemide  40 mg Oral BID  . gabapentin  400 mg Oral QHS  . isosorbide mononitrate  15 mg Oral Daily  . lisinopril  2.5 mg Oral Daily  . [START ON 05/13/2016] metoprolol succinate  12.5 mg Oral Daily  . metoprolol tartrate  12.5 mg Oral BID  . nicotine  21 mg Transdermal Daily  . OLANZapine  5 mg Oral QHS  . pantoprazole   40 mg Oral Q0600  . potassium chloride  20 mEq Oral Once  . potassium chloride  40 mEq Oral Daily  . rosuvastatin  20 mg Oral QHS  . sodium chloride flush      . sucralfate  1 g Oral QID  . tamsulosin  0.4 mg Oral Daily  . tiotropium  18 mcg Inhalation Daily  . venlafaxine XR  150 mg Oral Q breakfast  . vitamin C  250 mg Oral Daily   Continuous Infusions:  Active Problems:   Chest pain in adult   PVD (peripheral vascular disease) (HCC)   CHF (congestive heart failure) (HCC)   Tobacco abuse   Chest pain at rest   Parkinson disease (HCC)   GERD (gastroesophageal reflux disease)   Essential hypertension  Dyslipidemia   Depression   Dementia   Bipolar 1 disorder (HCC)   Anxiety disorder   Time spent:   Standley Dakins, MD, FAAFP [facility] Pager [phone]  If 7PM-7AM, please contact night-coverage [email or web address] Password [email or web address] 05/12/2016, 2:25 PM    LOS: 0 days

## 2016-05-13 DIAGNOSIS — R0789 Other chest pain: Secondary | ICD-10-CM

## 2016-05-13 LAB — BASIC METABOLIC PANEL
Anion gap: 7 (ref 5–15)
BUN: 18 mg/dL (ref 6–20)
CALCIUM: 9.1 mg/dL (ref 8.9–10.3)
CO2: 30 mmol/L (ref 22–32)
CREATININE: 0.91 mg/dL (ref 0.61–1.24)
Chloride: 103 mmol/L (ref 101–111)
Glucose, Bld: 105 mg/dL — ABNORMAL HIGH (ref 65–99)
Potassium: 3.7 mmol/L (ref 3.5–5.1)
SODIUM: 140 mmol/L (ref 135–145)

## 2016-05-13 LAB — CBC
HCT: 38.8 % — ABNORMAL LOW (ref 39.0–52.0)
Hemoglobin: 12.8 g/dL — ABNORMAL LOW (ref 13.0–17.0)
MCH: 32.3 pg (ref 26.0–34.0)
MCHC: 33 g/dL (ref 30.0–36.0)
MCV: 98 fL (ref 78.0–100.0)
RBC: 3.96 MIL/uL — ABNORMAL LOW (ref 4.22–5.81)
RDW: 13.1 % (ref 11.5–15.5)
WBC: 5 10*3/uL (ref 4.0–10.5)

## 2016-05-13 LAB — MAGNESIUM: MAGNESIUM: 1.9 mg/dL (ref 1.7–2.4)

## 2016-05-13 MED ORDER — METOPROLOL SUCCINATE ER 25 MG PO TB24: 12.5000 mg | ORAL_TABLET | Freq: Every day | ORAL | 0 refills | Status: DC

## 2016-05-13 MED ORDER — ISOSORBIDE MONONITRATE ER 60 MG PO TB24: 30.0000 mg | ORAL_TABLET | Freq: Every day | ORAL | Status: DC

## 2016-05-13 MED ORDER — CLONAZEPAM 1 MG PO TABS: 1.0000 mg | ORAL_TABLET | Freq: Three times a day (TID) | ORAL | 0 refills | Status: AC | PRN

## 2016-05-13 MED ORDER — ASPIRIN 325 MG PO TBEC: 325.0000 mg | DELAYED_RELEASE_TABLET | Freq: Every day | ORAL | 0 refills | Status: DC

## 2016-05-13 MED ORDER — ISOSORBIDE MONONITRATE ER 30 MG PO TB24: 30.0000 mg | ORAL_TABLET | Freq: Every day | ORAL | 0 refills | Status: DC

## 2016-05-13 MED ORDER — ISOSORBIDE MONONITRATE ER 30 MG PO TB24
15.0000 mg | ORAL_TABLET | Freq: Once | ORAL | Status: AC
  Administered 2016-05-13: 15 mg via ORAL
  Filled 2016-05-13: qty 1

## 2016-05-13 MED ORDER — LISINOPRIL 2.5 MG PO TABS: 2.5000 mg | ORAL_TABLET | Freq: Every day | ORAL | 0 refills | Status: DC

## 2016-05-13 MED ORDER — POLYETHYLENE GLYCOL 3350 17 G PO PACK
34.0000 g | PACK | Freq: Two times a day (BID) | ORAL | Status: DC
  Administered 2016-05-13: 34 g via ORAL
  Filled 2016-05-13: qty 2

## 2016-05-13 MED ORDER — POTASSIUM CHLORIDE CRYS ER 20 MEQ PO TBCR: 20.0000 meq | EXTENDED_RELEASE_TABLET | Freq: Every day | ORAL | 0 refills | Status: DC

## 2016-05-13 NOTE — Progress Notes (Signed)
Patient with orders to be discharge home. Discharge instructions given, patient and family verbalized understanding. Patient stable. Prescriptions given. Patient left in private vehicle with family.

## 2016-05-13 NOTE — Care Management Important Message (Signed)
Important Message  Patient Details  Name: Shane SpenceHoward C Thornton MRN: [id number] Date of Birth: 08/14/1940   Medicare Important Message Given:  Yes    Malcolm MetroChildress, Sayer Masini Demske, RN 05/13/2016, 11:04 AM

## 2016-05-13 NOTE — Discharge Summary (Signed)
Physician Discharge Summary  Shane Thornton NFA:[id number] DOB: [birth year] DOA: 05/11/2016  PCP: No primary care provider on file.  Admit date: 05/11/2016 Discharge date: 05/13/2016  Admitted From: Home  Disposition:  Home with Home health services  Recommendations for Outpatient Follow-up:  1. Follow up with PCP in 1 weeks 2. Follow up with cardiologist in 1 week as scheduled 3. Please obtain BMP/CBC in one week  Discharge Condition: STABLE  CODE STATUS: FULL    Brief/Interim Summary: HPI: Shane Thornton is a 76 y.o. male with known PVD s/p right carotid stent who was sent over from [facility] ER for admission for chest pain.  The patient reports that he has been having chest pain for past several weeks in intermittent bouts of pain and discomforts with some pain 9/10 but that has been rare.  He reports that he was seen a couple of weeks ago at [facility] for similar symptoms and apparently saw a cardiologist at that time but not able to give good history.  He says that he was placed on a cardiac monitor but did not have a stress test or cardiac cath.  Pt has no diaphoresis and no n/v/d.  He had a normal troponin and no acute findings on EKG reported from ED in [place].    1. Chest pain - negative workup for ACS. EKG with chronic LBBB. Echo with LVEF 40-45%, inferior/inferoseptal hypokinesis. Nuclear stress with evidence of prior inferior scar, no significant ischemia - started on imdur 15mg  as additional antianginal. No titrate of beta blocker due to low HRs at times.  - increase imdur to 30mg  today. Reasonable for discharge from cardiac standpoint. We will arrange cardiology outpatient f/u in 2 weeks.  - if refractory symptoms, may need to consider cath as outpatient.   2. Systolic dysfunction - LVEF 40-45% by echo - started on Toprol XL 12.5mg  and lisinopril 2.5mg  daily. - continue current meds  3. NSVT - occasional short runs of NSVT. The reported episode of  VT last night around 9pm is artifact, the native rhythm can be seen marching through the artifact rhythm - continue beta blocker.   4. Carotid stenosis - on DAPT for history of prior carotid stent.   5.  PVD - continue plavix and aspirin for now  6.  Tobacco abuse - counseled on cessation  7.  Hypokalemia - repleted  8. Parkinsonism - Follow up with neurologist recommendations.    Discharge Diagnoses:  Active Problems:   Chest pain in adult   PVD (peripheral vascular disease) (HCC)   CHF (congestive heart failure) (HCC)   Tobacco abuse   Chest pain at rest   Parkinson disease (HCC)   GERD (gastroesophageal reflux disease)   Essential hypertension   Dyslipidemia   Depression   Dementia   Bipolar 1 disorder (HCC)   Anxiety disorder Discharge Instructions  Discharge Instructions    Discharge instructions    Complete by:  As directed    Do not take Viagra, you cannot take this with your new heart medications.     Allergies as of 05/13/2016      Reactions   Avelox [moxifloxacin Hcl In Nacl] Other (See Comments)   unknown   Ciprofloxacin Other (See Comments)   depression   Horse Chestnut [aesculus] Other (See Comments)   Childhood allergy.   Metronidazole Other (See Comments)   unknown   Papaya Derivatives Hives      Medication List    STOP taking these medications  metoprolol tartrate 25 MG tablet Commonly known as:  LOPRESSOR   sildenafil 25 MG tablet Commonly known as:  VIAGRA     TAKE these medications   albuterol (2.5 MG/3ML) 0.083% nebulizer solution Commonly known as:  PROVENTIL Take 2.5 mg by nebulization daily as needed for wheezing or shortness of breath.   albuterol 108 (90 Base) MCG/ACT inhaler Commonly known as:  PROVENTIL HFA;VENTOLIN HFA Inhale 2 puffs into the lungs every 4 (four) hours as needed for wheezing or shortness of breath.   arformoterol 15 MCG/2ML Nebu Commonly known as:  BROVANA Take 15 mcg by nebulization 2 (two) times  daily.   aspirin 325 MG EC tablet Take 1 tablet (325 mg total) by mouth daily. Start taking on:  05/14/2016   carbidopa-levodopa 50-200 MG tablet Commonly known as:  SINEMET CR Take 1 tablet by mouth 2 (two) times daily.   clonazePAM 1 MG tablet Commonly known as:  KLONOPIN Take 1 tablet (1 mg total) by mouth 3 (three) times daily as needed for anxiety. What changed:  when to take this  reasons to take this   clopidogrel 75 MG tablet Commonly known as:  PLAVIX Take 75 mg by mouth daily.   desvenlafaxine 100 MG 24 hr tablet Commonly known as:  PRISTIQ Take 100 mg by mouth daily.   esomeprazole 40 MG capsule Commonly known as:  NEXIUM Take 40 mg by mouth daily at 12 noon.   finasteride 5 MG tablet Commonly known as:  PROSCAR Take 5 mg by mouth daily.   furosemide 40 MG tablet Commonly known as:  LASIX Take 40 mg by mouth 2 (two) times daily.   gabapentin 400 MG capsule Commonly known as:  NEURONTIN Take 400 mg by mouth at bedtime.   isosorbide mononitrate 30 MG 24 hr tablet Commonly known as:  IMDUR Take 1 tablet (30 mg total) by mouth daily. Start taking on:  05/14/2016   lisinopril 2.5 MG tablet Commonly known as:  PRINIVIL,ZESTRIL Take 1 tablet (2.5 mg total) by mouth daily. Start taking on:  05/14/2016   metoprolol succinate 25 MG 24 hr tablet Commonly known as:  TOPROL-XL Take 0.5 tablets (12.5 mg total) by mouth daily. Start taking on:  05/14/2016   MULTIVITAMIN/IRON PO Take 1 tablet by mouth daily.   nicotine 21 mg/24hr patch Commonly known as:  NICODERM CQ - dosed in mg/24 hours Place 21 mg onto the skin daily.   nicotine polacrilex 4 MG lozenge Commonly known as:  COMMIT Take 4 mg by mouth as needed for smoking cessation.   OLANZapine 5 MG tablet Commonly known as:  ZYPREXA Take 5 mg by mouth at bedtime.   potassium chloride SA 20 MEQ tablet Commonly known as:  K-DUR,KLOR-CON Take 1 tablet (20 mEq total) by mouth daily. What  changed:  medication strength  how much to take   rosuvastatin 20 MG tablet Commonly known as:  CRESTOR Take 20 mg by mouth at bedtime.   sucralfate 1 g tablet Commonly known as:  CARAFATE Take 1 g by mouth 4 (four) times daily.   tamsulosin 0.4 MG Caps capsule Commonly known as:  FLOMAX Take 0.4 mg by mouth daily.   tiotropium 18 MCG inhalation capsule Commonly known as:  SPIRIVA Place 18 mcg into inhaler and inhale daily.   vitamin C 250 MG tablet Commonly known as:  ASCORBIC ACID Take 250 mg by mouth daily.      Follow-up Information    [facility].  Specialty:  Home Health Services Contact information: [address] [place] [address] [phone]        Joni Reining, NP Follow up on 05/27/2016.   Specialties:  Nurse Practitioner, Radiology, Cardiology Why:  at 1:30 pm Contact information: [address] [place] [address] [phone]        Primary care provider. Schedule an appointment as soon as possible for a visit in 1 week(s).          Allergies  Allergen Reactions  . Avelox [Moxifloxacin Hcl In Nacl] Other (See Comments)    unknown  . Ciprofloxacin Other (See Comments)    depression  . Horse Chestnut [Aesculus] Other (See Comments)    Childhood allergy.  . Metronidazole Other (See Comments)    unknown  . Papaya Derivatives Hives    Procedures/Studies: Nm Myocar Multi W/spect W/wall Motion / Ef  Result Date: 05/12/2016  There was no ST segment deviation noted during stress.  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  This is an intermediate risk study. Intermediate risk based on mildly decreased LVEF and prior infarct. There is no significant current ischemia.Consider correlating findings with echo.  The left ventricular ejection fraction is mildly decreased (45-54%).    Dg Chest 2 View  Result Date: 05/11/2016 CLINICAL DATA:  Chest pain for  several weeks.  Right carotid stent. EXAM: CHEST  2 VIEW COMPARISON:  None. FINDINGS: Biapical pleuroparenchymal scarring more prominent on the right but without a masslike or convex inferior appearance. Peripheral right mid lung 10 mm very dense pulmonary nodule, probably a calcified granuloma given the sharp borders. Thoracic spondylosis. Mild bony demineralization. Mild enlargement of the cardiopericardial silhouette. Faint interstitial accentuation bilaterally without overt edema. No pleural effusion. Atherosclerotic calcification of the descending thoracic aorta. IMPRESSION: 1. Mild enlargement of the cardiopericardial silhouette. Faint interstitial accentuation is of uncertain chronicity but could conceivably reflect pulmonary venous hypertension and mild interstitial edema. 2. Suspected old granulomatous disease with a 10 mm calcified nodule peripherally in the right middle lobe. 3. Biapical pleuroparenchymal scarring. 4. Thoracic spondylosis. 5. Atherosclerosis. Electronically Signed   By: Gaylyn Rong M.D.   On: 05/11/2016 10:36    (Echo, Carotid, EGD, Colonoscopy, ERCP)    Subjective: Pt reports that overall improved.    Discharge Exam: Vitals:   05/12/16 2123 05/13/16 0533  BP: 132/63 (!) 114/44  Pulse: 72 (!) 51  Resp:  20  Temp:  98.4 F (36.9 C)   Vitals:   05/13/16 0533 05/13/16 0741 05/13/16 0745 05/13/16 0746  BP: (!) 114/44     Pulse: (!) 51     Resp: 20     Temp: 98.4 F (36.9 C)     TempSrc: Oral     SpO2: 98% 93% 93% 93%  Height:       General: Parkinsonism tremor. Pt is alert, awake, not in acute distress Cardiovascular: RRR, S1/S2 +, no rubs, no gallops Respiratory: CTA bilaterally, no wheezing, no rhonchi Abdominal: Soft, NT, ND, bowel sounds + Extremities: no edema, no cyanosis  The results of significant diagnostics from this hospitalization (including imaging, microbiology, ancillary and laboratory) are listed below for reference.      Microbiology: No results found for this or any previous visit (from the past 240 hour(s)).   Labs: BNP (last 3 results) No results for input(s): BNP in the last 8760 hours. Basic Metabolic Panel:  Recent Labs Lab 05/11/16 0936 05/12/16 1016 05/13/16 0533  NA 140 140  140  K 3.3* 3.1* 3.7  CL 101 104 103  CO2 31 30 30   GLUCOSE 93 104* 105*  BUN 16 16 18   CREATININE 0.82 0.91 0.91  CALCIUM 8.5* 8.9 9.1  MG  --  1.8 1.9   Liver Function Tests:  Recent Labs Lab 05/11/16 0936  AST 12*  ALT 14*  ALKPHOS 45  BILITOT 0.5  PROT 5.6*  ALBUMIN 3.2*   No results for input(s): LIPASE, AMYLASE in the last 168 hours. No results for input(s): AMMONIA in the last 168 hours. CBC:  Recent Labs Lab 05/11/16 0936 05/13/16 0533  WBC 3.8* 5.0  HGB 11.8* 12.8*  HCT 35.7* 38.8*  MCV 98.1 98.0  PLT 87* SPECIMEN CHECKED FOR CLOTS   Cardiac Enzymes:  Recent Labs Lab 05/11/16 1600 05/11/16 2118  TROPONINI <0.03 <0.03   BNP: Invalid input(s): POCBNP CBG: No results for input(s): GLUCAP in the last 168 hours. D-Dimer No results for input(s): DDIMER in the last 72 hours. Hgb A1c  Recent Labs  05/11/16 0936  HGBA1C 5.5   Lipid Profile  Recent Labs  05/11/16 0936  CHOL 118  HDL 42  LDLCALC 65  TRIG 57  CHOLHDL 2.8   Thyroid function studies  Recent Labs  05/11/16 0936  TSH 3.843   Anemia work up No results for input(s): VITAMINB12, FOLATE, FERRITIN, TIBC, IRON, RETICCTPCT in the last 72 hours. Urinalysis No results found for: COLORURINE, APPEARANCEUR, LABSPEC, PHURINE, GLUCOSEU, HGBUR, BILIRUBINUR, KETONESUR, PROTEINUR, UROBILINOGEN, NITRITE, LEUKOCYTESUR Sepsis Labs Invalid input(s): PROCALCITONIN,  WBC,  LACTICIDVEN Microbiology No results found for this or any previous visit (from the past 240 hour(s)).   Time coordinating discharge: 31 minutes  SIGNED:   Standley Dakins, MD  [facility] 05/13/2016, 1:52 PM Pager   If 7PM-7AM,  please contact night-coverage [email or web address] Password [email or web address]

## 2016-05-13 NOTE — Progress Notes (Signed)
Primary cardiologist:  Subjective:    Abdominal pain this AM.   Objective:   Temp:  [97.6 F (36.4 C)-99.2 F (37.3 C)] 98.4 F (36.9 C) (01/05 0533) Pulse Rate:  [51-72] 51 (01/05 0533) Resp:  [20] 20 (01/05 0533) BP: (114-137)/(44-65) 114/44 (01/05 0533) SpO2:  [93 %-98 %] 93 % (01/05 0746) Last BM Date: 05/10/16  There were no vitals filed for this visit.  Intake/Output Summary (Last 24 hours) at 05/13/16 1005 Last data filed at 05/13/16 0934  Gross per 24 hour  Intake              240 ml  Output             1350 ml  Net            -1110 ml    Telemetry:SR, short runs of NSVT  Exam:  General: NAD  HEENT: sclera clear, throat clear  Resp: CTAB  Cardiac: RRR, no m/r/g, no jvd  GI:  Abdomen soft, NT, ND  MSK:no LE edema.   Neuro: no focal deficits  Psych: blunt affect  Lab Results:  Basic Metabolic Panel:  Recent Labs Lab 05/11/16 0936 05/12/16 1016 05/13/16 0533  NA 140 140 140  K 3.3* 3.1* 3.7  CL 101 104 103  CO2 31 30 30   GLUCOSE 93 104* 105*  BUN 16 16 18   CREATININE 0.82 0.91 0.91  CALCIUM 8.5* 8.9 9.1  MG  --  1.8 1.9    Liver Function Tests:  Recent Labs Lab 05/11/16 0936  AST 12*  ALT 14*  ALKPHOS 45  BILITOT 0.5  PROT 5.6*  ALBUMIN 3.2*    CBC:  Recent Labs Lab 05/11/16 0936 05/13/16 0533  WBC 3.8* 5.0  HGB 11.8* 12.8*  HCT 35.7* 38.8*  MCV 98.1 98.0  PLT 87* SPECIMEN CHECKED FOR CLOTS    Cardiac Enzymes:  Recent Labs Lab 05/11/16 1600 05/11/16 2118  TROPONINI <0.03 <0.03    BNP: No results for input(s): PROBNP in the last 8760 hours.  Coagulation: No results for input(s): INR in the last 168 hours.  ECG:   Medications:   Scheduled Medications: . arformoterol  15 mcg Nebulization BID  . aspirin EC  325 mg Oral Daily  . carbidopa-levodopa  1 tablet Oral BID  . clopidogrel  75 mg Oral Daily  . finasteride  5 mg Oral Daily  . furosemide  40 mg Oral BID  . gabapentin  400 mg Oral QHS  .  isosorbide mononitrate  15 mg Oral Daily  . lisinopril  2.5 mg Oral Daily  . metoprolol succinate  12.5 mg Oral Daily  . nicotine  21 mg Transdermal Daily  . OLANZapine  5 mg Oral QHS  . pantoprazole  40 mg Oral Q0600  . potassium chloride  40 mEq Oral BID  . rosuvastatin  20 mg Oral QHS  . sucralfate  1 g Oral QID  . tamsulosin  0.4 mg Oral Daily  . tiotropium  18 mcg Inhalation Daily  . venlafaxine XR  150 mg Oral Q breakfast  . vitamin C  250 mg Oral Daily     Infusions:   PRN Medications:  acetaminophen, albuterol, clonazePAM, gi cocktail, morphine injection, nitroGLYCERIN, ondansetron (ZOFRAN) IV     Assessment/Plan   1. Chest pain - negative workup for ACS. EKG with chronic LBBB. Echo with LVEF 40-45%, inferior/inferoseptal hypokinesis. Nuclear stress with evidence of prior inferior scar, no significant ischemia - started on imdur  15mg  as additional antianginal. No titrate of beta blocker due to low HRs at times.  - increase imdur to 30mg  today. Reasonable for discharge from cardiac standpoint. We will arrange outpatient f/u in 2 weeks.  - if refractory symptoms, may need to consider cath as outpatient.   2. Systolic dysfunction - LVEF 40-45% by echo - started on Toprol XL 12.5mg  and lisinopril 2.5mg  daily. - continue current meds  3. NSVT - occasional short runs of NSVT. The reported episode of VT last night around 9pm is artifact, the native rhythm can be seen marching through the artifact rhythm - continue beta blocker.   4. Carotid stenosis - on DAPT for history of prior carotid stent.     Dina Rich, M.D.

## 2016-05-13 NOTE — Discharge Instructions (Signed)
Do not take viagra, you can't take this with your new heart medications.    Fall Prevention in the Home Introduction Falls can cause injuries. They can happen to people of all ages. There are many things you can do to make your home safe and to help prevent falls. What can I do on the outside of my home?  Regularly fix the edges of walkways and driveways and fix any cracks.  Remove anything that might make you trip as you walk through a door, such as a raised step or threshold.  Trim any bushes or trees on the path to your home.  Use bright outdoor lighting.  Clear any walking paths of anything that might make someone trip, such as rocks or tools.  Regularly check to see if handrails are loose or broken. Make sure that both sides of any steps have handrails.  Any raised decks and porches should have guardrails on the edges.  Have any leaves, snow, or ice cleared regularly.  Use sand or salt on walking paths during winter.  Clean up any spills in your garage right away. This includes oil or grease spills. What can I do in the bathroom?  Use night lights.  Install grab bars by the toilet and in the tub and shower. Do not use towel bars as grab bars.  Use non-skid mats or decals in the tub or shower.  If you need to sit down in the shower, use a plastic, non-slip stool.  Keep the floor dry. Clean up any water that spills on the floor as soon as it happens.  Remove soap buildup in the tub or shower regularly.  Attach bath mats securely with double-sided non-slip rug tape.  Do not have throw rugs and other things on the floor that can make you trip. What can I do in the bedroom?  Use night lights.  Make sure that you have a light by your bed that is easy to reach.  Do not use any sheets or blankets that are too big for your bed. They should not hang down onto the floor.  Have a firm chair that has side arms. You can use this for support while you get dressed.  Do not  have throw rugs and other things on the floor that can make you trip. What can I do in the kitchen?  Clean up any spills right away.  Avoid walking on wet floors.  Keep items that you use a lot in easy-to-reach places.  If you need to reach something above you, use a strong step stool that has a grab bar.  Keep electrical cords out of the way.  Do not use floor polish or wax that makes floors slippery. If you must use wax, use non-skid floor wax.  Do not have throw rugs and other things on the floor that can make you trip. What can I do with my stairs?  Do not leave any items on the stairs.  Make sure that there are handrails on both sides of the stairs and use them. Fix handrails that are broken or loose. Make sure that handrails are as long as the stairways.  Check any carpeting to make sure that it is firmly attached to the stairs. Fix any carpet that is loose or worn.  Avoid having throw rugs at the top or bottom of the stairs. If you do have throw rugs, attach them to the floor with carpet tape.  Make sure that you have a  light switch at the top of the stairs and the bottom of the stairs. If you do not have them, ask someone to add them for you. What else can I do to help prevent falls?  Wear shoes that:  Do not have high heels.  Have rubber bottoms.  Are comfortable and fit you well.  Are closed at the toe. Do not wear sandals.  If you use a stepladder:  Make sure that it is fully opened. Do not climb a closed stepladder.  Make sure that both sides of the stepladder are locked into place.  Ask someone to hold it for you, if possible.  Clearly mark and make sure that you can see:  Any grab bars or handrails.  First and last steps.  Where the edge of each step is.  Use tools that help you move around (mobility aids) if they are needed. These include:  Canes.  Walkers.  Scooters.  Crutches.  Turn on the lights when you go into a dark area. Replace  any light bulbs as soon as they burn out.  Set up your furniture so you have a clear path. Avoid moving your furniture around.  If any of your floors are uneven, fix them.  If there are any pets around you, be aware of where they are.  Review your medicines with your doctor. Some medicines can make you feel dizzy. This can increase your chance of falling. Ask your doctor what other things that you can do to help prevent falls. This information is not intended to replace advice given to you by your health care provider. Make sure you discuss any questions you have with your health care provider. Document Released: 02/19/2009 Document Revised: 10/01/2015 Document Reviewed: 05/30/2014  2017 Elsevier    Chest Wall Pain Chest wall pain is pain in or around the bones and muscles of your chest. Sometimes, an injury causes this pain. Sometimes, the cause may not be known. This pain may take several weeks or longer to get better. Follow these instructions at home: Pay attention to any changes in your symptoms. Take these actions to help with your pain:  Rest as told by your health care provider.  Avoid activities that cause pain. These include any activities that use your chest muscles or your abdominal and side muscles to lift heavy items.  If directed, apply ice to the painful area:  Put ice in a plastic bag.  Place a towel between your skin and the bag.  Leave the ice on for 20 minutes, 2-3 times per day.  Take over-the-counter and prescription medicines only as told by your health care provider.  Do not use tobacco products, including cigarettes, chewing tobacco, and e-cigarettes. If you need help quitting, ask your health care provider.  Keep all follow-up visits as told by your health care provider. This is important. Contact a health care provider if:  You have a fever.  Your chest pain becomes worse.  You have new symptoms. Get help right away if:  You have nausea or  vomiting.  You feel sweaty or light-headed.  You have a cough with phlegm (sputum) or you cough up blood.  You develop shortness of breath. This information is not intended to replace advice given to you by your health care provider. Make sure you discuss any questions you have with your health care provider. Document Released: 04/25/2005 Document Revised: 09/03/2015 Document Reviewed: 07/21/2014 Elsevier Interactive Patient Education  2017 Elsevier Inc.   Angina Pectoris  Angina pectoris is a very bad feeling in the chest, neck, or arm. Your doctor may call it angina. There are four types of angina. Angina is caused by a lack of blood in the middle and thickest layer of the heart wall (myocardium). Angina may feel like a crushing or squeezing pain in the chest. It may feel like tightness or heavy pressure in the chest. Some people say it feels like gas, heartburn, or indigestion. Some people have symptoms other than pain. These include:  Shortness of breath.  Cold sweats.  Feeling sick to your stomach (nausea).  Feeling light-headed. Many women have chest discomfort and some of the other symptoms. However, women often have different symptoms, such as:  Feeling tired (fatigue).  Feeling nervous for no reason.  Feeling weak for no reason.  Dizziness or fainting. Women may have angina without any symptoms. Follow these instructions at home:  Take medicines only as told by your doctor.  Take care of other health issues as told by your doctor. These include:  High blood pressure (hypertension).  Diabetes.  Follow a heart-healthy diet. Your doctor can help you to choose healthy food options and make changes.  Talk to your doctor to learn more about healthy cooking methods and use them. These include:  Roasting.  Grilling.  Broiling.  Baking.  Poaching.  Steaming.  Stir-frying.  Follow an exercise program approved by your doctor.  Keep a healthy weight. Lose  weight as told by your doctor.  Rest when you are tired.  Learn to manage stress.  Do not use any tobacco, such as cigarettes, chewing tobacco, or electronic cigarettes. If you need help quitting, ask your doctor.  If you drink alcohol, and your doctor says it is okay, limit yourself to no more than 1 drink per day. One drink equals 12 ounces of beer, 5 ounces of wine, or 1 ounces of hard liquor.  Stop illegal drug use.  Keep all follow-up visits as told by your doctor. This is important. Do not take these medicines unless your doctor says that you can:  Nonsteroidal anti-inflammatory drugs (NSAIDs). These include:  Ibuprofen.  Naproxen.  Celecoxib.  Vitamin supplements that have vitamin A, vitamin E, or both.  Hormone therapy that contains estrogen with or without progestin. Get help right away if:  You have pain in your chest, neck, arm, jaw, stomach, or back that:  Lasts more than a few minutes.  Comes back.  Does not get better after you take medicine under your tongue (sublingual nitroglycerin).  You have any of these symptoms for no reason:  Gas, heartburn, or indigestion.  Sweating a lot.  Shortness of breath or trouble breathing.  Feeling sick to your stomach or throwing up.  Feeling more tired than usual.  Feeling nervous or worrying more than usual.  Feeling weak.  Diarrhea.  You are suddenly dizzy or light-headed.  You faint or pass out. These symptoms may be an emergency. Do not wait to see if the symptoms will go away. Get medical help right away. Call your local emergency services (911 in the [place] Do not drive yourself to the hospital.  This information is not intended to replace advice given to you by your health care provider. Make sure you discuss any questions you have with your health care provider. Document Released: 10/12/2007 Document Revised: 10/01/2015 Document Reviewed: 08/27/2013 Elsevier Interactive Patient Education  2017  [employer]

## 2016-05-13 NOTE — Care Management Note (Signed)
Case Management Note  Patient Details  Name: Shane Thornton MRN: [id number] Date of Birth: 08/06/1940   Expected Discharge Date:    05/14/2015              Expected Discharge Plan:  Home w Home Health Services  In-House Referral:  NA  Discharge planning Services  CM Consult  Post Acute Care Choice:  Home Health Choice offered to:  Patient, Adult Children  DME Arranged:    DME Agency:     HH Arranged:  RN, PT, OT, Nurse's Aide HH Agency:  [facility]  Status of Service:  Completed, signed off  Additional Comments: Pt discharging home today. PT has recommended OP PT however after discussing with daughter, pt has extreme difficulty leaving home due to his parkinson's. [facility], [facility]. Kandee Keenory, of [facility], made aware of referral and will obtain pt info from chart. [facility]. Daughter at bedside to provide transportation home.  Shane Thornton, Shane Aguilera Demske, RN 05/13/2016, 11:11 AM

## 2016-05-27 ENCOUNTER — Encounter: Payer: Medicare Other | Admitting: Adult Health

## 2016-06-06 ENCOUNTER — Encounter: Payer: Self-pay | Admitting: Adult Health

## 2016-06-06 ENCOUNTER — Ambulatory Visit (INDEPENDENT_AMBULATORY_CARE_PROVIDER_SITE_OTHER): Payer: Medicare Other | Admitting: Adult Health

## 2016-06-06 VITALS — BP 110/68 | HR 87 | Ht 71.5 in | Wt 271.0 lb

## 2016-06-06 DIAGNOSIS — G20 Parkinson's disease: Secondary | ICD-10-CM | POA: Diagnosis not present

## 2016-06-06 DIAGNOSIS — I10 Essential (primary) hypertension: Secondary | ICD-10-CM | POA: Diagnosis not present

## 2016-06-06 DIAGNOSIS — I5043 Acute on chronic combined systolic (congestive) and diastolic (congestive) heart failure: Secondary | ICD-10-CM | POA: Diagnosis not present

## 2016-06-06 DIAGNOSIS — Z720 Tobacco use: Secondary | ICD-10-CM | POA: Diagnosis not present

## 2016-06-06 DIAGNOSIS — G20A1 Parkinson's disease without dyskinesia, without mention of fluctuations: Secondary | ICD-10-CM

## 2016-06-06 MED ORDER — ISOSORBIDE MONONITRATE ER 30 MG PO TB24: 30.0000 mg | ORAL_TABLET | Freq: Every day | ORAL | 3 refills | Status: DC

## 2016-06-06 MED ORDER — POTASSIUM CHLORIDE CRYS ER 20 MEQ PO TBCR: 20.0000 meq | EXTENDED_RELEASE_TABLET | Freq: Every day | ORAL | 3 refills | Status: AC

## 2016-06-06 MED ORDER — FUROSEMIDE 40 MG PO TABS
40.0000 mg | ORAL_TABLET | Freq: Two times a day (BID) | ORAL | 3 refills | Status: DC
Start: 2016-06-06 — End: 2017-02-27

## 2016-06-06 MED ORDER — LISINOPRIL 2.5 MG PO TABS: 2.5000 mg | ORAL_TABLET | Freq: Every day | ORAL | 3 refills | Status: DC

## 2016-06-06 MED ORDER — METOPROLOL SUCCINATE ER 25 MG PO TB24: 12.5000 mg | ORAL_TABLET | Freq: Every day | ORAL | 3 refills | Status: DC

## 2016-06-06 MED ORDER — CLOPIDOGREL BISULFATE 75 MG PO TABS: 75.0000 mg | ORAL_TABLET | Freq: Every day | ORAL | 3 refills | Status: DC

## 2016-06-06 NOTE — Progress Notes (Signed)
Cardiology Office Note   Date:  06/06/2016   ID:  Shane SpenceHoward C Thornton, DOB 10/11/1940, MRN [id number]  PCP:  No primary care provider on file.  Cardiologist: [place] Calix[facility]anch/  Akram Kissick, NP   Chief Complaint  Patient presents with  . PAD  . Hypertension      History of Present Illness: Shane Thornton is a 76 y.o. male who presents for ongoing assessment and management of peripheral vascular disease, status post right carotid artery stent, with multiple admissions to different hospitals in the setting of COPD exacerbation and chest pain. Other history includes anxiety, bipolar, dementia, dyslipidemia, and hypertension. Also ongoing tobacco abuse.  The patient was [facility] on 05/13/2016 after admission for recurrent chest pain. He was negative for ACS, EKG revealed chronic left bundle branch block, echo revealed EF of 40-45% with inferior inferior septal hypokinesis. The patient underwent nuclear stress MPI, which showed evidence of a prior inferior scar but no significant ischemia. He was started on Imdur 30mg  daily as an additional antianginal. Beta blocker was unable to be titrated due to bradycardia. He is here for follow-up.  [name] has multiple medical problems as stated above, as well as Parkinson's disease. He has significant and severe tremors. The patient does have nurses come by to check on him. He is inconsistent on taking his medications at the same time every day. He continues to have chest discomfort and has been seen by multiple physicians at multiple hospitals. He unfortunately continues to smoke.  Past Medical History:  Diagnosis Date  . Anxiety disorder   . Bipolar 1 disorder (HCC)   . Chest pain in adult 05/11/2016  . CHF (congestive heart failure) (HCC) 05/11/2016  . Dementia   . Depression   . Dyslipidemia   . Essential hypertension   . GERD (gastroesophageal reflux disease)   . Parkinson disease (HCC)   . PVD (peripheral vascular  disease) (HCC) 05/11/2016  . Tobacco abuse 05/11/2016    Past Surgical History:  Procedure Laterality Date  . CAROTID STENT Right      Current Outpatient Prescriptions  Medication Sig Dispense Refill  . albuterol (PROVENTIL HFA;VENTOLIN HFA) 108 (90 Base) MCG/ACT inhaler Inhale 2 puffs into the lungs every 4 (four) hours as needed for wheezing or shortness of breath.    Marland Kitchen. albuterol (PROVENTIL) (2.5 MG/3ML) 0.083% nebulizer solution Take 2.5 mg by nebulization daily as needed for wheezing or shortness of breath.    Marland Kitchen. arformoterol (BROVANA) 15 MCG/2ML NEBU Take 15 mcg by nebulization 2 (two) times daily.    Marland Kitchen. aspirin EC 325 MG EC tablet Take 1 tablet (325 mg total) by mouth daily. 30 tablet 0  . carbidopa-levodopa (SINEMET CR) 50-200 MG tablet Take 1 tablet by mouth 2 (two) times daily.    . clonazePAM (KLONOPIN) 1 MG tablet Take 1 tablet (1 mg total) by mouth 3 (three) times daily as needed for anxiety. 30 tablet 0  . clopidogrel (PLAVIX) 75 MG tablet Take 1 tablet (75 mg total) by mouth daily. 30 tablet 3  . desvenlafaxine (PRISTIQ) 100 MG 24 hr tablet Take 100 mg by mouth daily.    Marland Kitchen. esomeprazole (NEXIUM) 40 MG capsule Take 40 mg by mouth 2 (two) times daily.     . finasteride (PROSCAR) 5 MG tablet Take 5 mg by mouth daily.    . furosemide (LASIX) 40 MG tablet Take 1 tablet (40 mg total) by mouth 2 (two) times daily. 60 tablet 3  .  gabapentin (NEURONTIN) 400 MG capsule Take 600 mg by mouth 4 (four) times daily.     . isosorbide mononitrate (IMDUR) 30 MG 24 hr tablet Take 1 tablet (30 mg total) by mouth daily. 30 tablet 3  . lisinopril (PRINIVIL,ZESTRIL) 2.5 MG tablet Take 1 tablet (2.5 mg total) by mouth daily. 30 tablet 3  . metoprolol succinate (TOPROL-XL) 25 MG 24 hr tablet Take 0.5 tablets (12.5 mg total) by mouth daily. 30 tablet 3  . Multiple Vitamins-Iron (MULTIVITAMIN/IRON PO) Take 1 tablet by mouth daily.    . nicotine (NICODERM CQ - DOSED IN MG/24 HOURS) 21 mg/24hr patch Place 21  mg onto the skin daily.    . nicotine polacrilex (COMMIT) 4 MG lozenge Take 4 mg by mouth as needed for smoking cessation.    Marland Kitchen OLANZapine (ZYPREXA) 5 MG tablet Take 10 mg by mouth at bedtime.     . potassium chloride SA (K-DUR,KLOR-CON) 20 MEQ tablet Take 1 tablet (20 mEq total) by mouth daily. 30 tablet 3  . rosuvastatin (CRESTOR) 20 MG tablet Take 20 mg by mouth at bedtime.    . sucralfate (CARAFATE) 1 g tablet Take 1 g by mouth 4 (four) times daily.    . tamsulosin (FLOMAX) 0.4 MG CAPS capsule Take 0.4 mg by mouth daily.    Marland Kitchen tiotropium (SPIRIVA) 18 MCG inhalation capsule Place 18 mcg into inhaler and inhale daily.    . vitamin C (ASCORBIC ACID) 250 MG tablet Take 250 mg by mouth daily.     No current facility-administered medications for this visit.     Allergies:   Avelox [moxifloxacin hcl in nacl]; Ciprofloxacin; Horse chestnut [aesculus]; Metronidazole; and Papaya derivatives    Social History:  The patient  reports that he has been smoking Cigarettes.  He has never used smokeless tobacco. He reports that he drinks about 3.0 oz of alcohol per week . He reports that he does not use drugs.   Family History:  The patient's family history includes Heart attack in his father.    ROS: All other systems are reviewed and negative. Unless otherwise mentioned in H&P    PHYSICAL EXAM: VS:  BP 110/68   Pulse 87   Ht 5' 11.5" (1.816 m)   Wt 271 lb (122.9 kg)   SpO2 92%   BMI 37.27 kg/m  , BMI Body mass index is 37.27 kg/m. GEN: Well nourished, well developed, in no acute distress  HEENT: normal  Neck: no JVD, carotid bruits, or masses Cardiac: RRR; tachycardic, no murmurs, rubs, or gallops,no edema  Respiratory:  clear to auscultation bilaterally, normal work of breathing GI: soft, nontender, nondistended, + BS MS: no deformity or atrophy  Skin: warm and dry, no rash Neuro:  Strength and sensation are intact. Significant essential tremors. Psych: euthymic mood, full affect.  Confused and argumentative.   Recent Labs: 05/11/2016: ALT 14; TSH 3.843 05/13/2016: BUN 18; Creatinine, Ser 0.91; Hemoglobin 12.8; Magnesium 1.9; Platelets SPECIMEN CHECKED FOR CLOTS; Potassium 3.7; Sodium 140    Lipid Panel    Component Value Date/Time   CHOL 118 05/11/2016 0936   TRIG 57 05/11/2016 0936   HDL 42 05/11/2016 0936   CHOLHDL 2.8 05/11/2016 0936   VLDL 11 05/11/2016 0936   LDLCALC 65 05/11/2016 0936      Wt Readings from Last 3 Encounters:  06/06/16 271 lb (122.9 kg)     ASSESSMENT AND PLAN:  1.  Chronic chest pain: Nonobstructive coronary artery disease. The patient does have some  scarring which was found on a nuclear medicine stress test. The patient was not recommended for further invasive testing due to dementia, multiple core morbidities. He is being treated medically. He receives his medications locally and from a sendoff pharmacy. He has run out of multiple medications. His daughter who is with him fills his medicine trace, but sometimes he forgets to take them. The patient has been given refills on clipper to grow, Lasix, isosorbide, lisinopril, metoprolol, Crestor, and potassium.  2. Hypertension: Blood pressure is currently well-controlled. No changes in his regimen at this time.  3. Ongoing tobacco abuse: I have very serious concerns about safety with this gentleman as he has severe tremors and continues to smoke heavily. I believe there is a fire hazard, concerning dropping a cigarette and causing something flammable to begin to burn. I talked with him and his daughter who is in attendance about this. His daughter verbalizes understanding. He has no plans to quit at this time. The patient is recommended to be established with primary care physician, for ongoing management of multiple medical issues and facilitation of skilled nursing facility if safety issues become more of a concern. He is followed by Dr. Harland Dingwall for urgent care needs.,   4.  Hypercholesterolemia: Continue statin therapy   Current medicines are reviewed at length with the patient today.    Labs/ tests ordered today include:  No orders of the defined types were placed in this encounter.    Disposition:   FU with 6 months.   Signed, Joni Reining, NP  06/06/2016 3:54 PM    [facility] [address] [place], [place] [address] Phone: [phone]; Fax: [phone]

## 2016-06-06 NOTE — Patient Instructions (Signed)
Your physician wants you to follow-up in: 6 Months.  You will receive a reminder letter in the mail two months in advance. If you don't receive a letter, please call our office to schedule the follow-up appointment.  Your physician recommends that you continue on your current medications as directed. Please refer to the Current Medication list given to you today.  If you need a refill on your cardiac medications before your next appointment, please call your pharmacy.  Thank you for choosing [facility]!   

## 2016-06-06 NOTE — Progress Notes (Signed)
Name: Shane Thornton    DOB: 02/11/1941  Age: 76 y.o.  MR#: [id number]       PCP:  No primary care provider on file.      Insurance: Payor: MEDICARE / Plan: MEDICARE PART A AND B / Product Type: *No Product type* /   CC:   No chief complaint on file.   VS Vitals:   06/06/16 1408  BP: 110/68  Pulse: 87  SpO2: 92%  Weight: 271 lb (122.9 kg)  Height: 5' 11.5" (1.816 m)    Weights Current Weight  06/06/16 271 lb (122.9 kg)    Blood Pressure  BP Readings from Last 3 Encounters:  06/06/16 110/68  05/13/16 (!) 138/109     Admit date:  (Not on file) Last encounter with RMR:  Visit date not found   Allergy Avelox [moxifloxacin hcl in nacl]; Ciprofloxacin; Horse chestnut [aesculus]; Metronidazole; and Papaya derivatives  Current Outpatient Prescriptions  Medication Sig Dispense Refill  . albuterol (PROVENTIL HFA;VENTOLIN HFA) 108 (90 Base) MCG/ACT inhaler Inhale 2 puffs into the lungs every 4 (four) hours as needed for wheezing or shortness of breath.    Marland Kitchen. albuterol (PROVENTIL) (2.5 MG/3ML) 0.083% nebulizer solution Take 2.5 mg by nebulization daily as needed for wheezing or shortness of breath.    Marland Kitchen. arformoterol (BROVANA) 15 MCG/2ML NEBU Take 15 mcg by nebulization 2 (two) times daily.    Marland Kitchen. aspirin EC 325 MG EC tablet Take 1 tablet (325 mg total) by mouth daily. 30 tablet 0  . carbidopa-levodopa (SINEMET CR) 50-200 MG tablet Take 1 tablet by mouth 2 (two) times daily.    . clonazePAM (KLONOPIN) 1 MG tablet Take 1 tablet (1 mg total) by mouth 3 (three) times daily as needed for anxiety. 30 tablet 0  . clopidogrel (PLAVIX) 75 MG tablet Take 75 mg by mouth daily.    Marland Kitchen. desvenlafaxine (PRISTIQ) 100 MG 24 hr tablet Take 100 mg by mouth daily.    Marland Kitchen. esomeprazole (NEXIUM) 40 MG capsule Take 40 mg by mouth 2 (two) times daily.     . finasteride (PROSCAR) 5 MG tablet Take 5 mg by mouth daily.    . furosemide (LASIX) 40 MG tablet Take 40 mg by mouth 2 (two) times daily.    Marland Kitchen. gabapentin  (NEURONTIN) 400 MG capsule Take 600 mg by mouth 4 (four) times daily.     . isosorbide mononitrate (IMDUR) 30 MG 24 hr tablet Take 1 tablet (30 mg total) by mouth daily. 30 tablet 0  . lisinopril (PRINIVIL,ZESTRIL) 2.5 MG tablet Take 1 tablet (2.5 mg total) by mouth daily. 30 tablet 0  . metoprolol succinate (TOPROL-XL) 25 MG 24 hr tablet Take 0.5 tablets (12.5 mg total) by mouth daily. 30 tablet 0  . Multiple Vitamins-Iron (MULTIVITAMIN/IRON PO) Take 1 tablet by mouth daily.    . nicotine (NICODERM CQ - DOSED IN MG/24 HOURS) 21 mg/24hr patch Place 21 mg onto the skin daily.    . nicotine polacrilex (COMMIT) 4 MG lozenge Take 4 mg by mouth as needed for smoking cessation.    Marland Kitchen. OLANZapine (ZYPREXA) 5 MG tablet Take 10 mg by mouth at bedtime.     . potassium chloride (K-DUR,KLOR-CON) 20 MEQ tablet Take 1 tablet (20 mEq total) by mouth daily. 30 tablet 0  . rosuvastatin (CRESTOR) 20 MG tablet Take 20 mg by mouth at bedtime.    . sucralfate (CARAFATE) 1 g tablet Take 1 g by mouth 4 (four) times daily.    .Marland Kitchen  tamsulosin (FLOMAX) 0.4 MG CAPS capsule Take 0.4 mg by mouth daily.    Marland Kitchen tiotropium (SPIRIVA) 18 MCG inhalation capsule Place 18 mcg into inhaler and inhale daily.    . vitamin C (ASCORBIC ACID) 250 MG tablet Take 250 mg by mouth daily.     No current facility-administered medications for this visit.     Discontinued Meds:   There are no discontinued medications.  Patient Active Problem List   Diagnosis Date Noted  . Chest pain in adult 05/11/2016  . PVD (peripheral vascular disease) (HCC) 05/11/2016  . CHF (congestive heart failure) (HCC) 05/11/2016  . Tobacco abuse 05/11/2016  . Chest pain at rest 05/11/2016  . Parkinson disease (HCC)   . GERD (gastroesophageal reflux disease)   . Essential hypertension   . Dyslipidemia   . Depression   . Dementia   . Bipolar 1 disorder (HCC)   . Anxiety disorder     LABS    Component Value Date/Time   NA 140 05/13/2016 0533   NA 140  05/12/2016 1016   NA 140 05/11/2016 0936   K 3.7 05/13/2016 0533   K 3.1 (L) 05/12/2016 1016   K 3.3 (L) 05/11/2016 0936   CL 103 05/13/2016 0533   CL 104 05/12/2016 1016   CL 101 05/11/2016 0936   CO2 30 05/13/2016 0533   CO2 30 05/12/2016 1016   CO2 31 05/11/2016 0936   GLUCOSE 105 (H) 05/13/2016 0533   GLUCOSE 104 (H) 05/12/2016 1016   GLUCOSE 93 05/11/2016 0936   BUN 18 05/13/2016 0533   BUN 16 05/12/2016 1016   BUN 16 05/11/2016 0936   CREATININE 0.91 05/13/2016 0533   CREATININE 0.91 05/12/2016 1016   CREATININE 0.82 05/11/2016 0936   CALCIUM 9.1 05/13/2016 0533   CALCIUM 8.9 05/12/2016 1016   CALCIUM 8.5 (L) 05/11/2016 0936   GFRNONAA >60 05/13/2016 0533   GFRNONAA >60 05/12/2016 1016   GFRNONAA >60 05/11/2016 0936   GFRAA >60 05/13/2016 0533   GFRAA >60 05/12/2016 1016   GFRAA >60 05/11/2016 0936   CMP     Component Value Date/Time   NA 140 05/13/2016 0533   K 3.7 05/13/2016 0533   CL 103 05/13/2016 0533   CO2 30 05/13/2016 0533   GLUCOSE 105 (H) 05/13/2016 0533   BUN 18 05/13/2016 0533   CREATININE 0.91 05/13/2016 0533   CALCIUM 9.1 05/13/2016 0533   PROT 5.6 (L) 05/11/2016 0936   ALBUMIN 3.2 (L) 05/11/2016 0936   AST 12 (L) 05/11/2016 0936   ALT 14 (L) 05/11/2016 0936   ALKPHOS 45 05/11/2016 0936   BILITOT 0.5 05/11/2016 0936   GFRNONAA >60 05/13/2016 0533   GFRAA >60 05/13/2016 0533       Component Value Date/Time   WBC 5.0 05/13/2016 0533   WBC 3.8 (L) 05/11/2016 0936   HGB 12.8 (L) 05/13/2016 0533   HGB 11.8 (L) 05/11/2016 0936   HCT 38.8 (L) 05/13/2016 0533   HCT 35.7 (L) 05/11/2016 0936   MCV 98.0 05/13/2016 0533   MCV 98.1 05/11/2016 0936    Lipid Panel     Component Value Date/Time   CHOL 118 05/11/2016 0936   TRIG 57 05/11/2016 0936   HDL 42 05/11/2016 0936   CHOLHDL 2.8 05/11/2016 0936   VLDL 11 05/11/2016 0936   LDLCALC 65 05/11/2016 0936    ABG No results found for: PHART, PCO2ART, PO2ART, HCO3, TCO2, ACIDBASEDEF, O2SAT    Lab Results  Component Value Date  TSH 3.843 05/11/2016   BNP (last 3 results) No results for input(s): BNP in the last 8760 hours.  ProBNP (last 3 results) No results for input(s): PROBNP in the last 8760 hours.  Cardiac Panel (last 3 results) No results for input(s): CKTOTAL, CKMB, TROPONINI, RELINDX in the last 72 hours.  Iron/TIBC/Ferritin/ %Sat No results found for: IRON, TIBC, FERRITIN, IRONPCTSAT   EKG Orders placed or performed during the hospital encounter of 05/11/16  . EKG 12-Lead (on arrival)  . EKG 12-Lead (on arrival)     Prior Assessment and Plan Problem List as of 06/06/2016 Reviewed: 05/13/2016  1:46 PM by Standley Dakins, MD     Cardiovascular and Mediastinum   PVD (peripheral vascular disease) (HCC)   CHF (congestive heart failure) (HCC)   Essential hypertension     Digestive   GERD (gastroesophageal reflux disease)     Nervous and Auditory   Parkinson disease (HCC)   Dementia     Other   Chest pain in adult   Tobacco abuse   Chest pain at rest   Dyslipidemia   Depression   Bipolar 1 disorder (HCC)   Anxiety disorder       Imaging: Nm Myocar Multi W/spect W/wall Motion / Ef  Result Date: 05/12/2016  There was no ST segment deviation noted during stress.  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  This is an intermediate risk study. Intermediate risk based on mildly decreased LVEF and prior infarct. There is no significant current ischemia.Consider correlating findings with echo.  The left ventricular ejection fraction is mildly decreased (45-54%).    Dg Chest 2 View  Result Date: 05/11/2016 CLINICAL DATA:  Chest pain for several weeks.  Right carotid stent. EXAM: CHEST  2 VIEW COMPARISON:  None. FINDINGS: Biapical pleuroparenchymal scarring more prominent on the right but without a masslike or convex inferior appearance. Peripheral right mid lung 10 mm very dense pulmonary nodule, probably a  calcified granuloma given the sharp borders. Thoracic spondylosis. Mild bony demineralization. Mild enlargement of the cardiopericardial silhouette. Faint interstitial accentuation bilaterally without overt edema. No pleural effusion. Atherosclerotic calcification of the descending thoracic aorta. IMPRESSION: 1. Mild enlargement of the cardiopericardial silhouette. Faint interstitial accentuation is of uncertain chronicity but could conceivably reflect pulmonary venous hypertension and mild interstitial edema. 2. Suspected old granulomatous disease with a 10 mm calcified nodule peripherally in the right middle lobe. 3. Biapical pleuroparenchymal scarring. 4. Thoracic spondylosis. 5. Atherosclerosis. Electronically Signed   By: Gaylyn Rong M.D.   On: 05/11/2016 10:36

## 2016-07-13 ENCOUNTER — Other Ambulatory Visit: Payer: Self-pay | Admitting: Adult Health

## 2016-07-13 MED ORDER — ROSUVASTATIN CALCIUM 20 MG PO TABS: 20.0000 mg | ORAL_TABLET | Freq: Every day | ORAL | 3 refills | Status: AC

## 2016-07-13 MED ORDER — ISOSORBIDE MONONITRATE ER 30 MG PO TB24: 30.0000 mg | ORAL_TABLET | Freq: Every day | ORAL | 3 refills | Status: AC

## 2016-07-13 MED ORDER — METOPROLOL SUCCINATE ER 25 MG PO TB24: 12.5000 mg | ORAL_TABLET | Freq: Every day | ORAL | 3 refills | Status: AC

## 2016-07-13 MED ORDER — LISINOPRIL 2.5 MG PO TABS: 2.5000 mg | ORAL_TABLET | Freq: Every day | ORAL | 3 refills | Status: AC

## 2016-07-13 NOTE — Telephone Encounter (Signed)
Sent to [facility] ( 90 days supply)

## 2016-07-13 NOTE — Telephone Encounter (Signed)
[facility] [address], he'd also like a 90 day supply

## 2016-11-28 ENCOUNTER — Other Ambulatory Visit: Payer: Self-pay

## 2016-11-28 ENCOUNTER — Emergency Department (HOSPITAL_COMMUNITY): Payer: Medicare Other

## 2016-11-28 ENCOUNTER — Inpatient Hospital Stay (HOSPITAL_COMMUNITY)
Admission: EM | Admit: 2016-11-28 | Discharge: 2016-12-01 | DRG: 291 | Disposition: A | Discharge status: Home Health | Payer: Medicare Other | Attending: Internal Medicine | Admitting: Internal Medicine

## 2016-11-28 ENCOUNTER — Encounter (HOSPITAL_COMMUNITY): Payer: Self-pay

## 2016-11-28 DIAGNOSIS — I739 Peripheral vascular disease, unspecified: Secondary | ICD-10-CM | POA: Diagnosis present

## 2016-11-28 DIAGNOSIS — D649 Anemia, unspecified: Secondary | ICD-10-CM | POA: Diagnosis present

## 2016-11-28 DIAGNOSIS — E785 Hyperlipidemia, unspecified: Secondary | ICD-10-CM | POA: Diagnosis present

## 2016-11-28 DIAGNOSIS — F17210 Nicotine dependence, cigarettes, uncomplicated: Secondary | ICD-10-CM | POA: Diagnosis present

## 2016-11-28 DIAGNOSIS — J9601 Acute respiratory failure with hypoxia: Secondary | ICD-10-CM | POA: Diagnosis present

## 2016-11-28 DIAGNOSIS — I700 Atherosclerosis of aorta: Secondary | ICD-10-CM | POA: Diagnosis present

## 2016-11-28 DIAGNOSIS — I10 Essential (primary) hypertension: Secondary | ICD-10-CM | POA: Diagnosis present

## 2016-11-28 DIAGNOSIS — K219 Gastro-esophageal reflux disease without esophagitis: Secondary | ICD-10-CM | POA: Diagnosis present

## 2016-11-28 DIAGNOSIS — Z7902 Long term (current) use of antithrombotics/antiplatelets: Secondary | ICD-10-CM

## 2016-11-28 DIAGNOSIS — Z7982 Long term (current) use of aspirin: Secondary | ICD-10-CM

## 2016-11-28 DIAGNOSIS — Z79899 Other long term (current) drug therapy: Secondary | ICD-10-CM

## 2016-11-28 DIAGNOSIS — I509 Heart failure, unspecified: Secondary | ICD-10-CM

## 2016-11-28 DIAGNOSIS — Z720 Tobacco use: Secondary | ICD-10-CM | POA: Diagnosis present

## 2016-11-28 DIAGNOSIS — F0390 Unspecified dementia without behavioral disturbance: Secondary | ICD-10-CM | POA: Diagnosis present

## 2016-11-28 DIAGNOSIS — R079 Chest pain, unspecified: Secondary | ICD-10-CM | POA: Diagnosis not present

## 2016-11-28 DIAGNOSIS — I447 Left bundle-branch block, unspecified: Secondary | ICD-10-CM | POA: Diagnosis present

## 2016-11-28 DIAGNOSIS — I110 Hypertensive heart disease with heart failure: Principal | ICD-10-CM | POA: Diagnosis present

## 2016-11-28 DIAGNOSIS — F419 Anxiety disorder, unspecified: Secondary | ICD-10-CM | POA: Diagnosis present

## 2016-11-28 DIAGNOSIS — F319 Bipolar disorder, unspecified: Secondary | ICD-10-CM | POA: Diagnosis present

## 2016-11-28 DIAGNOSIS — I5023 Acute on chronic systolic (congestive) heart failure: Secondary | ICD-10-CM | POA: Diagnosis present

## 2016-11-28 DIAGNOSIS — G20 Parkinson's disease: Secondary | ICD-10-CM | POA: Diagnosis present

## 2016-11-28 DIAGNOSIS — R0902 Hypoxemia: Secondary | ICD-10-CM

## 2016-11-28 DIAGNOSIS — J8410 Pulmonary fibrosis, unspecified: Secondary | ICD-10-CM | POA: Diagnosis present

## 2016-11-28 DIAGNOSIS — J441 Chronic obstructive pulmonary disease with (acute) exacerbation: Secondary | ICD-10-CM | POA: Diagnosis present

## 2016-11-28 LAB — BRAIN NATRIURETIC PEPTIDE: B Natriuretic Peptide: 166 pg/mL — ABNORMAL HIGH (ref 0.0–100.0)

## 2016-11-28 LAB — BLOOD GAS, ARTERIAL
ACID-BASE EXCESS: 5.1 mmol/L — AB (ref 0.0–2.0)
Bicarbonate: 28.1 mmol/L — ABNORMAL HIGH (ref 20.0–28.0)
DRAWN BY: 317771
O2 Content: 3 L/min
O2 SAT: 92.5 %
PH ART: 7.361 (ref 7.350–7.450)
pCO2 arterial: 54.9 mmHg — ABNORMAL HIGH (ref 32.0–48.0)
pO2, Arterial: 65.9 mmHg — ABNORMAL LOW (ref 83.0–108.0)

## 2016-11-28 LAB — BASIC METABOLIC PANEL
Anion gap: 8 (ref 5–15)
BUN: 10 mg/dL (ref 6–20)
CHLORIDE: 93 mmol/L — AB (ref 101–111)
CO2: 30 mmol/L (ref 22–32)
Calcium: 8.3 mg/dL — ABNORMAL LOW (ref 8.9–10.3)
Creatinine, Ser: 0.98 mg/dL (ref 0.61–1.24)
GFR calc non Af Amer: >60 mL/min (ref >60)
Glucose, Bld: 96 mg/dL (ref 65–99)
POTASSIUM: 3.7 mmol/L (ref 3.5–5.1)
SODIUM: 131 mmol/L — AB (ref 135–145)

## 2016-11-28 LAB — CBC
HCT: 33.6 % — ABNORMAL LOW (ref 39.0–52.0)
HEMOGLOBIN: 10.7 g/dL — AB (ref 13.0–17.0)
MCH: 31.8 pg (ref 26.0–34.0)
MCHC: 31.8 g/dL (ref 30.0–36.0)
MCV: 99.7 fL (ref 78.0–100.0)
Platelets: 138 10*3/uL — ABNORMAL LOW (ref 150–400)
RBC: 3.37 MIL/uL — AB (ref 4.22–5.81)
RDW: 13.7 % (ref 11.5–15.5)
WBC: 5.9 10*3/uL (ref 4.0–10.5)

## 2016-11-28 LAB — DIFFERENTIAL
BASOS ABS: 0 10*3/uL (ref 0.0–0.1)
Basophils Relative: 0 %
EOS ABS: 0 10*3/uL (ref 0.0–0.7)
Eosinophils Relative: 1 %
LYMPHS ABS: 1.1 10*3/uL (ref 0.7–4.0)
Lymphocytes Relative: 19 %
MONOS PCT: 7 %
Monocytes Absolute: 0.4 10*3/uL (ref 0.1–1.0)
NEUTROS ABS: 4.3 10*3/uL (ref 1.7–7.7)
Neutrophils Relative %: 73 %

## 2016-11-28 LAB — TROPONIN I: Troponin I: <0.03 ng/mL (ref <0.03)

## 2016-11-28 MED ORDER — ACETAMINOPHEN 325 MG PO TABS
650.0000 mg | ORAL_TABLET | ORAL | Status: DC | PRN
  Administered 2016-12-01: 650 mg via ORAL
  Filled 2016-11-28: qty 2

## 2016-11-28 MED ORDER — POTASSIUM CHLORIDE CRYS ER 20 MEQ PO TBCR
20.0000 meq | EXTENDED_RELEASE_TABLET | Freq: Every day | ORAL | Status: DC
  Administered 2016-11-29: 20 meq via ORAL
  Filled 2016-11-28: qty 1

## 2016-11-28 MED ORDER — SUCRALFATE 1 G PO TABS
1.0000 g | ORAL_TABLET | Freq: Four times a day (QID) | ORAL | Status: DC
  Administered 2016-11-28 – 2016-12-01 (×9): 1 g via ORAL
  Filled 2016-11-28 (×9): qty 1

## 2016-11-28 MED ORDER — CLONAZEPAM 0.5 MG PO TABS
1.0000 mg | ORAL_TABLET | Freq: Three times a day (TID) | ORAL | Status: DC | PRN
  Administered 2016-11-28 – 2016-12-01 (×4): 1 mg via ORAL
  Filled 2016-11-28 (×4): qty 2

## 2016-11-28 MED ORDER — VITAMIN C 500 MG PO TABS
250.0000 mg | ORAL_TABLET | Freq: Every day | ORAL | Status: DC
  Administered 2016-11-29 – 2016-12-01 (×3): 250 mg via ORAL
  Filled 2016-11-28 (×3): qty 1

## 2016-11-28 MED ORDER — SODIUM CHLORIDE 0.9 % IV SOLN: 250.0000 mL | INTRAVENOUS | Status: DC | PRN

## 2016-11-28 MED ORDER — ROSUVASTATIN CALCIUM 20 MG PO TABS
20.0000 mg | ORAL_TABLET | Freq: Every day | ORAL | Status: DC
  Administered 2016-11-28 – 2016-11-30 (×3): 20 mg via ORAL
  Filled 2016-11-28 (×3): qty 1

## 2016-11-28 MED ORDER — TAMSULOSIN HCL 0.4 MG PO CAPS
0.4000 mg | ORAL_CAPSULE | Freq: Every day | ORAL | Status: DC
  Administered 2016-11-29 – 2016-12-01 (×3): 0.4 mg via ORAL
  Filled 2016-11-28 (×3): qty 1

## 2016-11-28 MED ORDER — METHYLPREDNISOLONE SODIUM SUCC 125 MG IJ SOLR
125.0000 mg | Freq: Once | INTRAMUSCULAR | Status: AC
  Administered 2016-11-28: 125 mg via INTRAVENOUS
  Filled 2016-11-28: qty 2

## 2016-11-28 MED ORDER — HYDROCODONE-ACETAMINOPHEN 7.5-325 MG PO TABS
1.0000 | ORAL_TABLET | Freq: Four times a day (QID) | ORAL | Status: DC
  Administered 2016-11-28 – 2016-12-01 (×10): 1 via ORAL
  Filled 2016-11-28 (×11): qty 1

## 2016-11-28 MED ORDER — GABAPENTIN 300 MG PO CAPS
600.0000 mg | ORAL_CAPSULE | Freq: Four times a day (QID) | ORAL | Status: DC
  Administered 2016-11-28 – 2016-12-01 (×9): 600 mg via ORAL
  Filled 2016-11-28 (×9): qty 2

## 2016-11-28 MED ORDER — IPRATROPIUM-ALBUTEROL 0.5-2.5 (3) MG/3ML IN SOLN
3.0000 mL | Freq: Four times a day (QID) | RESPIRATORY_TRACT | Status: DC
  Administered 2016-11-28 – 2016-12-01 (×8): 3 mL via RESPIRATORY_TRACT
  Filled 2016-11-28 (×10): qty 3

## 2016-11-28 MED ORDER — ONDANSETRON HCL 4 MG/2ML IJ SOLN
4.0000 mg | Freq: Four times a day (QID) | INTRAMUSCULAR | Status: DC | PRN
  Administered 2016-11-29: 4 mg via INTRAVENOUS
  Filled 2016-11-28: qty 2

## 2016-11-28 MED ORDER — ASPIRIN EC 325 MG PO TBEC
325.0000 mg | DELAYED_RELEASE_TABLET | Freq: Every day | ORAL | Status: DC
  Administered 2016-11-29 – 2016-12-01 (×3): 325 mg via ORAL
  Filled 2016-11-28 (×3): qty 1

## 2016-11-28 MED ORDER — VENLAFAXINE HCL ER 75 MG PO CP24
150.0000 mg | ORAL_CAPSULE | Freq: Every day | ORAL | Status: DC
  Administered 2016-11-29 – 2016-12-01 (×3): 150 mg via ORAL
  Filled 2016-11-28 (×3): qty 2

## 2016-11-28 MED ORDER — IPRATROPIUM-ALBUTEROL 0.5-2.5 (3) MG/3ML IN SOLN
3.0000 mL | Freq: Once | RESPIRATORY_TRACT | Status: AC
  Administered 2016-11-28: 3 mL via RESPIRATORY_TRACT
  Filled 2016-11-28: qty 3

## 2016-11-28 MED ORDER — ALBUTEROL SULFATE (2.5 MG/3ML) 0.083% IN NEBU: 2.5000 mg | INHALATION_SOLUTION | RESPIRATORY_TRACT | Status: DC | PRN

## 2016-11-28 MED ORDER — OLANZAPINE 5 MG PO TABS
10.0000 mg | ORAL_TABLET | Freq: Every day | ORAL | Status: DC
  Administered 2016-11-28 – 2016-11-30 (×3): 10 mg via ORAL
  Filled 2016-11-28 (×3): qty 2

## 2016-11-28 MED ORDER — SODIUM CHLORIDE 0.9% FLUSH
3.0000 mL | Freq: Two times a day (BID) | INTRAVENOUS | Status: DC
  Administered 2016-11-28 – 2016-12-01 (×6): 3 mL via INTRAVENOUS

## 2016-11-28 MED ORDER — LISINOPRIL 5 MG PO TABS
2.5000 mg | ORAL_TABLET | Freq: Every day | ORAL | Status: DC
  Administered 2016-11-29 – 2016-12-01 (×3): 2.5 mg via ORAL
  Filled 2016-11-28 (×3): qty 1

## 2016-11-28 MED ORDER — ALBUTEROL SULFATE (2.5 MG/3ML) 0.083% IN NEBU
2.5000 mg | INHALATION_SOLUTION | Freq: Once | RESPIRATORY_TRACT | Status: AC
  Administered 2016-11-28: 2.5 mg via RESPIRATORY_TRACT
  Filled 2016-11-28: qty 3

## 2016-11-28 MED ORDER — FUROSEMIDE 10 MG/ML IJ SOLN
40.0000 mg | Freq: Once | INTRAMUSCULAR | Status: AC
  Administered 2016-11-28: 40 mg via INTRAVENOUS
  Filled 2016-11-28: qty 4

## 2016-11-28 MED ORDER — FUROSEMIDE 10 MG/ML IJ SOLN
40.0000 mg | Freq: Two times a day (BID) | INTRAMUSCULAR | Status: DC
  Administered 2016-11-29: 40 mg via INTRAVENOUS
  Filled 2016-11-28: qty 4

## 2016-11-28 MED ORDER — METOPROLOL SUCCINATE ER 25 MG PO TB24
12.5000 mg | ORAL_TABLET | Freq: Every day | ORAL | Status: DC
  Administered 2016-11-29 – 2016-12-01 (×3): 12.5 mg via ORAL
  Filled 2016-11-28 (×3): qty 1

## 2016-11-28 MED ORDER — NICOTINE 21 MG/24HR TD PT24
21.0000 mg | MEDICATED_PATCH | Freq: Every day | TRANSDERMAL | Status: DC
  Administered 2016-11-28 – 2016-12-01 (×4): 21 mg via TRANSDERMAL
  Filled 2016-11-28 (×4): qty 1

## 2016-11-28 MED ORDER — FINASTERIDE 5 MG PO TABS
5.0000 mg | ORAL_TABLET | Freq: Every day | ORAL | Status: DC
  Administered 2016-11-29 – 2016-12-01 (×3): 5 mg via ORAL
  Filled 2016-11-28 (×4): qty 1

## 2016-11-28 MED ORDER — SODIUM CHLORIDE 0.9% FLUSH: 3.0000 mL | INTRAVENOUS | Status: DC | PRN

## 2016-11-28 MED ORDER — ISOSORBIDE MONONITRATE ER 60 MG PO TB24
30.0000 mg | ORAL_TABLET | Freq: Every day | ORAL | Status: DC
  Administered 2016-11-29 – 2016-12-01 (×3): 30 mg via ORAL
  Filled 2016-11-28 (×3): qty 1

## 2016-11-28 MED ORDER — ENOXAPARIN SODIUM 40 MG/0.4ML ~~LOC~~ SOLN
40.0000 mg | SUBCUTANEOUS | Status: DC
  Administered 2016-11-28 – 2016-11-30 (×3): 40 mg via SUBCUTANEOUS
  Filled 2016-11-28 (×3): qty 0.4

## 2016-11-28 MED ORDER — CARBIDOPA-LEVODOPA ER 50-200 MG PO TBCR
1.0000 | EXTENDED_RELEASE_TABLET | Freq: Two times a day (BID) | ORAL | Status: DC
  Administered 2016-11-28 – 2016-12-01 (×6): 1 via ORAL
  Filled 2016-11-28 (×9): qty 1

## 2016-11-28 MED ORDER — CLOPIDOGREL BISULFATE 75 MG PO TABS
75.0000 mg | ORAL_TABLET | Freq: Every day | ORAL | Status: DC
  Administered 2016-11-29 – 2016-12-01 (×3): 75 mg via ORAL
  Filled 2016-11-28 (×3): qty 1

## 2016-11-28 MED ORDER — PANTOPRAZOLE SODIUM 40 MG PO TBEC
80.0000 mg | DELAYED_RELEASE_TABLET | Freq: Every day | ORAL | Status: DC
  Administered 2016-11-29 – 2016-12-01 (×3): 80 mg via ORAL
  Filled 2016-11-28 (×3): qty 2

## 2016-11-28 NOTE — ED Triage Notes (Signed)
Reports of chest pain and shortness of breath started this morning. Patient wears night time oxygen. Sats in triage 80% on room air.

## 2016-11-28 NOTE — ED Provider Notes (Signed)
[facility]-EMER[facility]ENCY DEPT Provider Note   CSN: [id number] Arrival date & time: 11/28/16  1504     History   Chief Complaint Chief Complaint  Patient presents with  . Chest Pain  . Shortness of Breath    HPI Shane Thornton is a 76 y.o. male.  The history is provided by the patient. The history is limited by the condition of the patient (Hx dementia).  Chest Pain   Associated symptoms include shortness of breath.  Shortness of Breath  Associated symptoms include chest pain.  Pt was seen at 1605. Per pt, c/o gradual onset and worsening of persistent SOB for "a while now," worse today. Pt states his O2 Sat at home was "69%" (pt does not wear O2). Pt states he has had intermittent chest "pains" for the past 3 months. CP occurs on exertion, lasts approximately 5 to 15 minutes before improving with resting. Pt has not called his PMD or Cards MD regarding his symptoms. Denies palpitations, no cough, no abd pain, no N/V/D, no fevers.   Past Medical History:  Diagnosis Date  . Anxiety disorder   . Bipolar 1 disorder (HCC)   . Chest pain in adult 05/11/2016  . CHF (congestive heart failure) (HCC) 05/11/2016  . Dementia   . Depression   . Dyslipidemia   . Essential hypertension   . GERD (gastroesophageal reflux disease)   . Parkinson disease (HCC)   . PVD (peripheral vascular disease) (HCC) 05/11/2016  . Tobacco abuse 05/11/2016    Patient Active Problem List   Diagnosis Date Noted  . Chest pain in adult 05/11/2016  . PVD (peripheral vascular disease) (HCC) 05/11/2016  . CHF (congestive heart failure) (HCC) 05/11/2016  . Tobacco abuse 05/11/2016  . Chest pain at rest 05/11/2016  . Parkinson disease (HCC)   . GERD (gastroesophageal reflux disease)   . Essential hypertension   . Dyslipidemia   . Depression   . Dementia   . Bipolar 1 disorder (HCC)   . Anxiety disorder     Past Surgical History:  Procedure Laterality Date  . CAROTID STENT Right        Home Medications    Prior  to Admission medications   Medication Sig Start Date End Date Taking? Authorizing Provider  albuterol (PROVENTIL HFA;VENTOLIN HFA) 108 (90 Base) MCG/ACT inhaler Inhale 2 puffs into the lungs every 4 (four) hours as needed for wheezing or shortness of breath.    [provider]  albuterol (PROVENTIL) (2.5 MG/3ML) 0.083% nebulizer solution Take 2.5 mg by nebulization daily as needed for wheezing or shortness of breath.    [provider]  arformoterol (BROVANA) 15 MCG/2ML NEBU Take 15 mcg by nebulization 2 (two) times daily.    [provider]  aspirin EC 325 MG EC tablet Take 1 tablet (325 mg total) by mouth daily. 05/14/16   Johnson, Clanford L, MD  carbidopa-levodopa (SINEMET CR) 50-200 MG tablet Take 1 tablet by mouth 2 (two) times daily.    [provider]  clonazePAM (KLONOPIN) 1 MG tablet Take 1 tablet (1 mg total) by mouth 3 (three) times daily as needed for anxiety. 05/13/16   Johnson, Clanford L, MD  clopidogrel (PLAVIX) 75 MG tablet Take 1 tablet (75 mg total) by mouth daily. 06/06/16   Jodelle Gross, NP  desvenlafaxine (PRISTIQ) 100 MG 24 hr tablet Take 100 mg by mouth daily.    [provider]  esomeprazole (NEXIUM) 40 MG capsule Take 40 mg by mouth 2 (two)  times daily.     [provider]  finasteride (PROSCAR) 5 MG tablet Take 5 mg by mouth daily.    [provider]  furosemide (LASIX) 40 MG tablet Take 1 tablet (40 mg total) by mouth 2 (two) times daily. 06/06/16   Jodelle GrossLawrence, Kathryn M, NP  gabapentin (NEURONTIN) 400 MG capsule Take 600 mg by mouth 4 (four) times daily.     [provider]  isosorbide mononitrate (IMDUR) 30 MG 24 hr tablet Take 1 tablet (30 mg total) by mouth daily. 07/13/16   Jodelle GrossLawrence, Kathryn M, NP  lisinopril (PRINIVIL,ZESTRIL) 2.5 MG tablet Take 1 tablet (2.5 mg total) by mouth daily. 07/13/16   Jodelle GrossLawrence, Kathryn M, NP  metoprolol succinate (TOPROL-XL) 25 MG 24 hr tablet Take 0.5 tablets (12.5 mg  total) by mouth daily. 07/13/16   Jodelle GrossLawrence, Kathryn M, NP  Multiple Vitamins-Iron (MULTIVITAMIN/IRON PO) Take 1 tablet by mouth daily.    [provider]  nicotine (NICODERM CQ - DOSED IN MG/24 HOURS) 21 mg/24hr patch Place 21 mg onto the skin daily.    [provider]  nicotine polacrilex (COMMIT) 4 MG lozenge Take 4 mg by mouth as needed for smoking cessation.    [provider]  OLANZapine (ZYPREXA) 5 MG tablet Take 10 mg by mouth at bedtime.     [provider]  potassium chloride SA (K-DUR,KLOR-CON) 20 MEQ tablet Take 1 tablet (20 mEq total) by mouth daily. 06/06/16   Jodelle GrossLawrence, Kathryn M, NP  rosuvastatin (CRESTOR) 20 MG tablet Take 1 tablet (20 mg total) by mouth at bedtime. 07/13/16   Jodelle GrossLawrence, Kathryn M, NP  sucralfate (CARAFATE) 1 g tablet Take 1 g by mouth 4 (four) times daily.    [provider]  tamsulosin (FLOMAX) 0.4 MG CAPS capsule Take 0.4 mg by mouth daily.    [provider]  tiotropium (SPIRIVA) 18 MCG inhalation capsule Place 18 mcg into inhaler and inhale daily.    [provider]  vitamin C (ASCORBIC ACID) 250 MG tablet Take 250 mg by mouth daily.    [provider]    Family History Family History  Problem Relation Age of Onset  . Heart attack Father     Social History Social History  Substance Use Topics  . Smoking status: Former Smoker    Types: Cigarettes  . Smokeless tobacco: Never Used  . Alcohol use 3.0 oz/week    5 Cans of beer per week     Allergies   Avelox [moxifloxacin hcl in nacl]; Ciprofloxacin; Horse chestnut [aesculus]; Metronidazole; and Papaya derivatives   Review of Systems Review of Systems  Unable to perform ROS: Dementia  Respiratory: Positive for shortness of breath.   Cardiovascular: Positive for chest pain.     Physical Exam Updated Vital Signs BP (!) 116/53 (BP Location: Right Arm)   Pulse 84   Temp 98.3 F (36.8 C) (Oral)   Resp (!) 26   Ht 6' (1.829 m)    Wt 131.5 kg (290 lb)   SpO2 (!) 80%   BMI 39.33 kg/m   Physical Exam 1610: Physical examination:  Nursing notes reviewed; Vital signs and O2 SAT reviewed;  Constitutional: Well developed, Well nourished, Well hydrated, In no acute distress; Head:  Normocephalic, atraumatic; Eyes: EOMI, PERRL, No scleral icterus; ENMT: Mouth and pharynx normal, Mucous membranes moist; Neck: Supple, Full range of motion, No lymphadenopathy; Cardiovascular: Regular rate and rhythm, No gallop; Respiratory: Breath sounds coarse & equal bilaterally, No wheezes.  Speaking  full sentences with ease, Normal respiratory effort/excursion; Chest: Nontender, Movement normal; Abdomen: Soft, Nontender, Nondistended, Normal bowel sounds; Genitourinary: No CVA tenderness; Extremities: Pulses normal, No tenderness, +2 pedal edema bilat. No calf asymmetry.; Neuro: Awake, alert, vague historian. Major CN grossly intact. No facial droop. Speech clear. No gross focal motor deficits in extremities.; Skin: Color normal, Warm, Dry.   ED Treatments / Results  Labs (all labs ordered are listed, but only abnormal results are displayed)   EKG  EKG Interpretation  Date/Time:  Monday November 28 2016 15:47:36 EDT Ventricular Rate:  71 PR Interval:  198 QRS Duration: 162 QT Interval:  430 QTC Calculation: 467 R Axis:   -61 Text Interpretation:  Normal sinus rhythm Left axis deviation Left bundle branch block When compared with ECG of 05/11/2016 Left bundle branch block is now Present Confirmed by [facility]  MD, Nicholos Johns [phone]) on 11/28/2016 4:23:47 PM       Radiology   Procedures Procedures (including critical care time)  Medications Ordered in ED Medications - No data to display   Initial Impression / Assessment and Plan / ED Course  I have reviewed the triage vital signs and the nursing notes.  Pertinent labs & imaging results that were available during my care of the patient were reviewed by me and considered in my medical  decision making (see chart for details).  MDM Reviewed: previous chart, nursing note and vitals Reviewed previous: labs and ECG Interpretation: labs, x-ray and ECG   Results for orders placed or performed during the hospital encounter of 11/28/16  Basic metabolic panel  Result Value Ref Range   Sodium 131 (L) 135 - 145 mmol/L   Potassium 3.7 3.5 - 5.1 mmol/L   Chloride 93 (L) 101 - 111 mmol/L   CO2 30 22 - 32 mmol/L   Glucose, Bld 96 65 - 99 mg/dL   BUN 10 6 - 20 mg/dL   Creatinine, Ser 6.04 0.61 - 1.24 mg/dL   Calcium 8.3 (L) 8.9 - 10.3 mg/dL   GFR calc non Af Amer >60 >60 mL/min   GFR calc Af Amer >60 >60 mL/min   Anion gap 8 5 - 15  CBC  Result Value Ref Range   WBC 5.9 4.0 - 10.5 K/uL   RBC 3.37 (L) 4.22 - 5.81 MIL/uL   Hemoglobin 10.7 (L) 13.0 - 17.0 g/dL   HCT 54.0 (L) 98.1 - 19.1 %   MCV 99.7 78.0 - 100.0 fL   MCH 31.8 26.0 - 34.0 pg   MCHC 31.8 30.0 - 36.0 g/dL   RDW 47.8 29.5 - 62.1 %   Platelets 138 (L) 150 - 400 K/uL  Troponin I  Result Value Ref Range   Troponin I <0.03 <0.03 ng/mL  Brain natriuretic peptide  Result Value Ref Range   B Natriuretic Peptide 166.0 (H) 0.0 - 100.0 pg/mL  Differential  Result Value Ref Range   Neutrophils Relative % 73 %   Neutro Abs 4.3 1.7 - 7.7 K/uL   Lymphocytes Relative 19 %   Lymphs Abs 1.1 0.7 - 4.0 K/uL   Monocytes Relative 7 %   Monocytes Absolute 0.4 0.1 - 1.0 K/uL   Eosinophils Relative 1 %   Eosinophils Absolute 0.0 0.0 - 0.7 K/uL   Basophils Relative 0 %   Basophils Absolute 0.0 0.0 - 0.1 K/uL   Dg Chest 2 View Result Date: 11/28/2016 CLINICAL DATA:  Acute onset of chest pain and shortness of breath that began this morning. Hypoxemia with  oxygen saturation 80% on room air. Patient requires oxygen at night. EXAM: CHEST  2 VIEW COMPARISON:  05/11/2016. FINDINGS: Cardiac silhouette moderately enlarged. Thoracic aorta atherosclerotic, unchanged. Hilar and mediastinal contours otherwise unremarkable. Mild to  moderate diffuse interstitial pulmonary edema, superimposed upon the baseline changes of emphysema and interstitial fibrosis. No confluent airspace consolidation. Densely calcified granuloma in the right mid lung as noted previously. Possible small right pleural effusion. Degenerative changes involving the thoracic spine. IMPRESSION: 1. 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 2. Aortic Atherosclerosis (ICD10-170.0) 3.  Emphysema. ([id number]) Electronically Signed   By: Hulan Saas M.D.   On: 11/28/2016 16:58    1750:  Pt's O2 Sats on arrival 80% R/A. IV solumedrol and short neb given. O2 N/C applied with O2 Sats increasing to 95-97%. CHF on CXR;  IV lasix given. Dx and testing d/w pt.  Questions answered.  Verb understanding, agreeable to admit.  T/C to [facility]. Ophelia Charter, case discussed, including:  HPI, pertinent PM/SHx, VS/PE, dx testing, ED course and treatment:  Agreeable to admit.    Final Clinical Impressions(s) / ED Diagnoses   Final diagnoses:  None    New Prescriptions New Prescriptions   No medications on file      Samuel Jester, DO 12/02/16 [id number]

## 2016-11-28 NOTE — ED Notes (Signed)
Patient placed on 3 lpm of oxygen, sats increased to 95%.

## 2016-11-28 NOTE — H&P (Signed)
History and Physical    [name]:[id number] DOB: March 03, 1941 DOA: 11/28/2016  PCP: Aniceto Boss, MD Consultants:  Vernice Jefferson - neurology; Su - psychiatry Patient coming from:  Home - lives alone; [place]: daughter, [phone]  Chief Complaint: SOB, chronic chest pain  HPI: Shane Thornton is a 76 y.o. male with medical history significant of PVD; Parkinson's; GERD; HTN; HLD; Bipolar; dementia; CHF; and tobacco dependence presenting with chest pain and SOB.  "I wouldn't getting my breath".  He also reports that his chest was hurting.  He is not sure how long this has been going on, several months.  Chest pain comes and goes.  He was hospitalized in January for this problem.  Worse with exertion.  Better with rest or "I just have to wait till it quits."  No LE edema.  ?wheezing.  +cough - productive of "some phlegm, yellowish-green".   No fevers.  During his January hospitalization, he had a negative work-up for ACS including a negative nuclear stress test.  He was started on Imdur.  He had an echo with EF 40-45%.     ED Course: 80% on room air upon arrival.  Given IV solumedrol and Duoneb with improvement in sats to 95-97% on O2.  CHF on CXR - given IV Lasix.  Review of Systems: As per HPI; otherwise review of systems reviewed and negative.   Ambulatory Status:  Ambulates with a cane  Past Medical History:  Diagnosis Date  . Anxiety disorder   . Bipolar 1 disorder (HCC)   . Chest pain in adult 05/11/2016  . CHF (congestive heart failure) (HCC) 05/11/2016  . Dementia   . Depression   . Dyslipidemia   . Essential hypertension   . GERD (gastroesophageal reflux disease)   . Parkinson disease (HCC)   . PVD (peripheral vascular disease) (HCC) 05/11/2016  . Tobacco abuse 05/11/2016    Past Surgical History:  Procedure Laterality Date  . CAROTID STENT Right     Social History   Social History  . Marital status: Married    Spouse name: N/A  . Number of children: N/A  . Years of  education: N/A   Occupational History  . retired    Social History Main Topics  . Smoking status: Current Every Day Smoker    Packs/day: 2.00    Years: 64.00    Types: Cigarettes  . Smokeless tobacco: Never Used     Comment: "quit" a few days ago; previously quit for 4 months and went back  . Alcohol use 3.6 oz/week    6 Cans of beer per week     Comment: daily  . Drug use: No  . Sexual activity: Not on file   Other Topics Concern  . Not on file   Social History Narrative  . No narrative on file    Allergies  Allergen Reactions  . Avelox [Moxifloxacin Hcl In Nacl] Other (See Comments)    unknown  . Ciprofloxacin Other (See Comments)    depression  . Horse Chestnut [Aesculus] Other (See Comments)    Childhood allergy.  . Metronidazole Other (See Comments)    unknown  . Papaya Derivatives Hives    Family History  Problem Relation Age of Onset  . Heart attack Father     Prior to Admission medications   Medication Sig Start Date End Date Taking? Authorizing Provider  albuterol (PROVENTIL HFA;VENTOLIN HFA) 108 (90 Base) MCG/ACT inhaler Inhale 2 puffs into the lungs every 4 (four)  hours as needed for wheezing or shortness of breath.    [provider]  albuterol (PROVENTIL) (2.5 MG/3ML) 0.083% nebulizer solution Take 2.5 mg by nebulization daily as needed for wheezing or shortness of breath.    [provider]  arformoterol (BROVANA) 15 MCG/2ML NEBU Take 15 mcg by nebulization 2 (two) times daily.    [provider]  aspirin EC 325 MG EC tablet Take 1 tablet (325 mg total) by mouth daily. 05/14/16   Johnson, Clanford L, MD  carbidopa-levodopa (SINEMET CR) 50-200 MG tablet Take 1 tablet by mouth 2 (two) times daily.    [provider]  clonazePAM (KLONOPIN) 1 MG tablet Take 1 tablet (1 mg total) by mouth 3 (three) times daily as needed for anxiety. 05/13/16   Johnson, Clanford L, MD  clopidogrel (PLAVIX) 75 MG tablet Take 1 tablet (75 mg  total) by mouth daily. 06/06/16   Jodelle GrossLawrence, Kathryn M, NP  desvenlafaxine (PRISTIQ) 100 MG 24 hr tablet Take 100 mg by mouth daily.    [provider]  esomeprazole (NEXIUM) 40 MG capsule Take 40 mg by mouth 2 (two) times daily.     [provider]  finasteride (PROSCAR) 5 MG tablet Take 5 mg by mouth daily.    [provider]  furosemide (LASIX) 40 MG tablet Take 1 tablet (40 mg total) by mouth 2 (two) times daily. 06/06/16   Jodelle GrossLawrence, Kathryn M, NP  gabapentin (NEURONTIN) 400 MG capsule Take 600 mg by mouth 4 (four) times daily.     [provider]  isosorbide mononitrate (IMDUR) 30 MG 24 hr tablet Take 1 tablet (30 mg total) by mouth daily. 07/13/16   Jodelle GrossLawrence, Kathryn M, NP  lisinopril (PRINIVIL,ZESTRIL) 2.5 MG tablet Take 1 tablet (2.5 mg total) by mouth daily. 07/13/16   Jodelle GrossLawrence, Kathryn M, NP  metoprolol succinate (TOPROL-XL) 25 MG 24 hr tablet Take 0.5 tablets (12.5 mg total) by mouth daily. 07/13/16   Jodelle GrossLawrence, Kathryn M, NP  Multiple Vitamins-Iron (MULTIVITAMIN/IRON PO) Take 1 tablet by mouth daily.    [provider]  nicotine (NICODERM CQ - DOSED IN MG/24 HOURS) 21 mg/24hr patch Place 21 mg onto the skin daily.    [provider]  nicotine polacrilex (COMMIT) 4 MG lozenge Take 4 mg by mouth as needed for smoking cessation.    [provider]  OLANZapine (ZYPREXA) 5 MG tablet Take 10 mg by mouth at bedtime.     [provider]  potassium chloride SA (K-DUR,KLOR-CON) 20 MEQ tablet Take 1 tablet (20 mEq total) by mouth daily. 06/06/16   Jodelle GrossLawrence, Kathryn M, NP  rosuvastatin (CRESTOR) 20 MG tablet Take 1 tablet (20 mg total) by mouth at bedtime. 07/13/16   Jodelle GrossLawrence, Kathryn M, NP  sucralfate (CARAFATE) 1 g tablet Take 1 g by mouth 4 (four) times daily.    [provider]  tamsulosin (FLOMAX) 0.4 MG CAPS capsule Take 0.4 mg by mouth daily.    [provider]  tiotropium (SPIRIVA) 18 MCG inhalation capsule Place 18  mcg into inhaler and inhale daily.    [provider]  vitamin C (ASCORBIC ACID) 250 MG tablet Take 250 mg by mouth daily.    [provider]    Physical Exam: Vitals:   11/28/16 1730 11/28/16 1800 11/28/16 1835 11/28/16 1845  BP: 134/64 135/70    Pulse: 68 66 80 73  Resp:    (!) 23  Temp:      TempSrc:  SpO2: 95% 100% 96% 96%  Weight:      Height:         General:  Appears calm and comfortable and is NAD; he is somnolent and a poor historian Eyes:  PERRL, EOMI, normal lids, iris ENT:  grossly normal hearing, lips & tongue, mmm; poor dentition Neck:  no LAD, masses or thyromegaly Cardiovascular:  RRR, 2-3/6 systolic murmur, no r/g. Trace LE edema. No JVD. Respiratory: Bibasilar crackles with diffuse wheezing.  Increased respiratory effort. Abdomen: soft, ntnd, NABS Skin:  no rash or induration seen on limited exam Musculoskeletal:  grossly normal tone BUE/BLE, good ROM, no bony abnormality Psychiatric:  blunted mood and affect, speech slow but appropriate, AOx3, poor historian Neurologic:  CN 2-12 grossly intact, moves all extremities in coordinated fashion, sensation intact  Labs on Admission: I have personally reviewed following labs and imaging studies  CBC:  Recent Labs Lab 11/28/16 1605 11/28/16 1617  WBC 5.9  --   NEUTROABS  --  4.3  HGB 10.7*  --   HCT 33.6*  --   MCV 99.7  --   PLT 138*  --    Basic Metabolic Panel:  Recent Labs Lab 11/28/16 1605  NA 131*  K 3.7  CL 93*  CO2 30  GLUCOSE 96  BUN 10  CREATININE 0.98  CALCIUM 8.3*   GFR: Estimated Creatinine Clearance: 92.8 mL/min (by C-G formula based on SCr of 0.98 mg/dL). Liver Function Tests: No results for input(s): AST, ALT, ALKPHOS, BILITOT, PROT, ALBUMIN in the last 168 hours. No results for input(s): LIPASE, AMYLASE in the last 168 hours. No results for input(s): AMMONIA in the last 168 hours. Coagulation Profile: No results for input(s): INR, PROTIME in the last  168 hours. Cardiac Enzymes:  Recent Labs Lab 11/28/16 1605  TROPONINI <0.03   BNP (last 3 results) No results for input(s): PROBNP in the last 8760 hours. HbA1C: No results for input(s): HGBA1C in the last 72 hours. CBG: No results for input(s): GLUCAP in the last 168 hours. Lipid Profile: No results for input(s): CHOL, HDL, LDLCALC, TRIG, CHOLHDL, LDLDIRECT in the last 72 hours. Thyroid Function Tests: No results for input(s): TSH, T4TOTAL, FREET4, T3FREE, THYROIDAB in the last 72 hours. Anemia Panel: No results for input(s): VITAMINB12, FOLATE, FERRITIN, TIBC, IRON, RETICCTPCT in the last 72 hours. Urine analysis: No results found for: COLORURINE, APPEARANCEUR, LABSPEC, PHURINE, GLUCOSEU, HGBUR, BILIRUBINUR, KETONESUR, PROTEINUR, UROBILINOGEN, NITRITE, LEUKOCYTESUR  Creatinine Clearance: Estimated Creatinine Clearance: 92.8 mL/min (by C-G formula based on SCr of 0.98 mg/dL).  Sepsis Labs: @LABRCNTIP (procalcitonin:4,lacticidven:4) )No results found for this or any previous visit (from the past 240 hour(s)).   Radiological Exams on Admission: Dg Chest 2 View  Result Date: 11/28/2016 CLINICAL DATA:  Acute onset of chest pain and shortness of breath that began this morning. Hypoxemia with oxygen saturation 80% on room air. Patient requires oxygen at night. EXAM: CHEST  2 VIEW COMPARISON:  05/11/2016. FINDINGS: Cardiac silhouette moderately enlarged. Thoracic aorta atherosclerotic, unchanged. Hilar and mediastinal contours otherwise unremarkable. Mild to moderate diffuse interstitial pulmonary edema, superimposed upon the baseline changes of emphysema and interstitial fibrosis. No confluent airspace consolidation. Densely calcified granuloma in the right mid lung as noted previously. Possible small right pleural effusion. Degenerative changes involving the thoracic spine. IMPRESSION: 1. CHF, with mild to moderate interstitial pulmonary edema and moderate cardiomegaly, superimposed upon  baseline COPD/emphysema and pulmonary fibrosis. 2. Aortic Atherosclerosis (ICD10-170.0) 3.  Emphysema. (ZOX09-U04.9) Electronically Signed   By: Hulan Saas  M.D.   On: 11/28/2016 16:58    EKG: Independently reviewed.  NSR with rate 71; LBBB present Assessment/Plan Principal Problem:   Acute respiratory failure with hypoxia (HCC) Active Problems:   CHF (congestive heart failure) (HCC)   Tobacco abuse   Essential hypertension   Dementia   Bipolar 1 disorder (HCC)   COPD with acute exacerbation (HCC)   Normocytic anemia   Acute respiratory failure -Patient who is not on home O2 presenting with initial O2 sats in 80s and c/o SOB -He did have increased respiratory effort at the time of my evaluation -Patient has known h/o CHF (see below); suspect that this is contributing but may not be solely responsible for current presentation -Patient also with long-standing significant smoking history and apparent COPD on CXR; suspect that this is also playing a role (see below) -Patient with reported h/o dementia but he was somnolent and somewhat slow to answer questions in the ER.  While this may be his baseline, will check ABG to ensure that this is not related to hypercarbic respiratory failure.  CHF exacerbation -Normal WBC count, no fever, CXR not c/w PNA -Elevated BNP to 166, unknown baseline -Initial EKG with apparent new LBBB -CXR indicates mild to moderate pulmonary edema -Patient has a long-standing h/o chest pain with negative nuclear stress during his last admission -It is hard to ascertain by history how acute his current chest pain symptoms are - although clearly the SOB is acute -Initial troponin negative, will continue to cycle x 3 although doubt ACS based on symptoms -Will place in observation status with telemetry -Will request repeat echocardiogram - prior Echo in 1/18 with EF 40-45%, will check for decline and/or wall motion abnormality to determine if cardiology needs to  consult on patient -Will continue Plavix and Imdur -Will continue Lisinopril and Toprol -CHF order set utilized; may need CHF team/cardiology consult but will hold until Echo results are available -Was given Lasix 40 mg x 1 in ER and will repeat with 40 mg BID -Continue [place] O2 for now -Normal kidney function at this time, will follow -Repeat EKG in AM -Na++ 131 is likely due to hypervolemia  COPD -Patient does not have a reported h/o COPD but has a long-standing smoking history; has COPD changes on CXR; and uses Spiriva and Brovana. -He likely does have underlying COPD but does not have a clear exacerbation at this time. -For now, will give Duonebs standing and Albuterol q6h without further antibiotics or steroids (he received 125 mg Solumedrol while in the ER)  HTN -Continue ACE and beta-blocker -He may need additional medication due to suboptimal control while in the ER -Will follow as inpatient  Dementia/bipolar/Parkinson's -Continue home medications - Sinemet, Klonopin, Pristiq (changed to Effexor as inpatient for formulary reasons), Neurontin, Zyprexa, Norco -I have reviewed this patient in the [place] Controlled Substances Reporting System.  He is receiving his medications from multiple providers but appears to be taking them as prescribed.  Anemia -Hgb 10.7, prior 12.8 on 1/5 - normocytic -Possibly chronic disease -Denies bleeding -Will test stools for occult blood  Tobacco dependence -He reports quitting 4 days ago - the room, however, smells of tobacco smoke. -Encourage cessation.  This was discussed with the patient and should be reviewed on an ongoing basis.   -Patch ordered    DVT prophylaxis: Lovenox  Code Status:  Full - confirmed with patient Family Communication: None present Disposition Plan:  Home once clinically improved Consults called: CM/SW/RT/Nutrition/PT/OT Admission status: It is my  clinical opinion that referral for OBSERVATION is reasonable and necessary  in this patient based on the above information provided. The aforementioned taken together are felt to place the patient at high risk for further clinical deterioration. However it is anticipated that the patient may be medically stable for discharge from the hospital within 24 to 48 hours.     Jonah Blue MD [facility]  If 7PM-7AM, please contact night-coverage [email or web address] Password [facility]  11/28/2016, 7:35 PM

## 2016-11-28 NOTE — ED Notes (Signed)
Patient transported to X-ray 

## 2016-11-28 NOTE — ED Notes (Signed)
Unable to chart urine output due to pt urinated out of urinal due to tremors due to parkinson's dx

## 2016-11-29 ENCOUNTER — Inpatient Hospital Stay (HOSPITAL_COMMUNITY): Payer: Medicare Other

## 2016-11-29 ENCOUNTER — Other Ambulatory Visit: Payer: Self-pay

## 2016-11-29 DIAGNOSIS — J441 Chronic obstructive pulmonary disease with (acute) exacerbation: Secondary | ICD-10-CM | POA: Diagnosis present

## 2016-11-29 DIAGNOSIS — Z7902 Long term (current) use of antithrombotics/antiplatelets: Secondary | ICD-10-CM | POA: Diagnosis not present

## 2016-11-29 DIAGNOSIS — I5023 Acute on chronic systolic (congestive) heart failure: Secondary | ICD-10-CM | POA: Diagnosis not present

## 2016-11-29 DIAGNOSIS — I110 Hypertensive heart disease with heart failure: Secondary | ICD-10-CM | POA: Diagnosis present

## 2016-11-29 DIAGNOSIS — I350 Nonrheumatic aortic (valve) stenosis: Secondary | ICD-10-CM | POA: Diagnosis not present

## 2016-11-29 DIAGNOSIS — I739 Peripheral vascular disease, unspecified: Secondary | ICD-10-CM | POA: Diagnosis present

## 2016-11-29 DIAGNOSIS — I700 Atherosclerosis of aorta: Secondary | ICD-10-CM | POA: Diagnosis present

## 2016-11-29 DIAGNOSIS — R079 Chest pain, unspecified: Secondary | ICD-10-CM | POA: Diagnosis present

## 2016-11-29 DIAGNOSIS — G20 Parkinson's disease: Secondary | ICD-10-CM | POA: Diagnosis present

## 2016-11-29 DIAGNOSIS — F419 Anxiety disorder, unspecified: Secondary | ICD-10-CM | POA: Diagnosis present

## 2016-11-29 DIAGNOSIS — F17210 Nicotine dependence, cigarettes, uncomplicated: Secondary | ICD-10-CM | POA: Diagnosis present

## 2016-11-29 DIAGNOSIS — I509 Heart failure, unspecified: Secondary | ICD-10-CM | POA: Diagnosis not present

## 2016-11-29 DIAGNOSIS — Z79899 Other long term (current) drug therapy: Secondary | ICD-10-CM | POA: Diagnosis not present

## 2016-11-29 DIAGNOSIS — E785 Hyperlipidemia, unspecified: Secondary | ICD-10-CM | POA: Diagnosis present

## 2016-11-29 DIAGNOSIS — D649 Anemia, unspecified: Secondary | ICD-10-CM | POA: Diagnosis present

## 2016-11-29 DIAGNOSIS — K219 Gastro-esophageal reflux disease without esophagitis: Secondary | ICD-10-CM | POA: Diagnosis present

## 2016-11-29 DIAGNOSIS — F319 Bipolar disorder, unspecified: Secondary | ICD-10-CM | POA: Diagnosis not present

## 2016-11-29 DIAGNOSIS — I447 Left bundle-branch block, unspecified: Secondary | ICD-10-CM | POA: Diagnosis present

## 2016-11-29 DIAGNOSIS — Z7982 Long term (current) use of aspirin: Secondary | ICD-10-CM | POA: Diagnosis not present

## 2016-11-29 DIAGNOSIS — F0390 Unspecified dementia without behavioral disturbance: Secondary | ICD-10-CM | POA: Diagnosis present

## 2016-11-29 DIAGNOSIS — J9601 Acute respiratory failure with hypoxia: Secondary | ICD-10-CM | POA: Diagnosis not present

## 2016-11-29 DIAGNOSIS — J8410 Pulmonary fibrosis, unspecified: Secondary | ICD-10-CM | POA: Diagnosis present

## 2016-11-29 LAB — ECHOCARDIOGRAM COMPLETE
AV Area mean vel: 1.57 cm2
AV Mean grad: 16 mmHg
AV Peak grad: 33 mmHg
AV area mean vel ind: 0.63 cm2/m2
AV peak Index: 0.6
AV vel: 2.5
AVA: 2.5 cm2
AVAREAVTI: 1.48 cm2
AVAREAVTIIND: 1.01 cm2/m2
AVCELMEANRAT: 0.45
AVLVOTPG: 6 mmHg
AVPKVEL: 288 cm/s
Ao pk vel: 0.43 m/s
CHL CUP DOP CALC LVOT VTI: 49.2 cm
DOP CAL AO MEAN VELOCITY: 185 cm/s
EERAT: 10.23
EWDT: 264 ms
Height: 72 in
LA vol A4C: 82.8 ml
LAVOL: 75.1 mL
LAVOLIN: 30.3 mL/m2
LDCA: 3.46 cm2
LV TDI E'LATERAL: 12.9
LV e' LATERAL: 12.9 cm/s
LVEEAVG: 10.23
LVEEMED: 10.23
LVOT SV: 170 mL
LVOTD: 21 mm
LVOTPV: 123 cm/s
LVOTVTI: 0.72 cm
MV Dec: 264
MV pk A vel: 133 m/s
MV pk E vel: 132 m/s
MVPG: 7 mmHg
RV sys press: 48 mmHg
Reg peak vel: 309 cm/s
TDI e' medial: 6.96
TRMAXVEL: 309 cm/s
VTI: 68 cm
Valve area index: 1.01
Weight: 4560.88 oz

## 2016-11-29 LAB — BASIC METABOLIC PANEL
ANION GAP: 11 (ref 5–15)
BUN: 12 mg/dL (ref 6–20)
CHLORIDE: 97 mmol/L — AB (ref 101–111)
CO2: 28 mmol/L (ref 22–32)
CREATININE: 1.07 mg/dL (ref 0.61–1.24)
Calcium: 8.4 mg/dL — ABNORMAL LOW (ref 8.9–10.3)
GFR calc non Af Amer: >60 mL/min (ref >60)
GLUCOSE: 159 mg/dL — AB (ref 65–99)
Potassium: 5.3 mmol/L — ABNORMAL HIGH (ref 3.5–5.1)
Sodium: 136 mmol/L (ref 135–145)

## 2016-11-29 LAB — CBC WITH DIFFERENTIAL/PLATELET
BASOS PCT: 0 %
Basophils Absolute: 0 10*3/uL (ref 0.0–0.1)
EOS PCT: 0 %
Eosinophils Absolute: 0 10*3/uL (ref 0.0–0.7)
HEMATOCRIT: 36.8 % — AB (ref 39.0–52.0)
Hemoglobin: 11.9 g/dL — ABNORMAL LOW (ref 13.0–17.0)
LYMPHS PCT: 10 %
Lymphs Abs: 0.4 10*3/uL — ABNORMAL LOW (ref 0.7–4.0)
MCH: 32 pg (ref 26.0–34.0)
MCHC: 32.3 g/dL (ref 30.0–36.0)
MCV: 98.9 fL (ref 78.0–100.0)
MONO ABS: 0.1 10*3/uL (ref 0.1–1.0)
MONOS PCT: 1 %
Neutro Abs: 3.1 10*3/uL (ref 1.7–7.7)
Neutrophils Relative %: 89 %
Platelets: 133 10*3/uL — ABNORMAL LOW (ref 150–400)
RBC: 3.72 MIL/uL — ABNORMAL LOW (ref 4.22–5.81)
RDW: 13.4 % (ref 11.5–15.5)
WBC: 3.5 10*3/uL — ABNORMAL LOW (ref 4.0–10.5)

## 2016-11-29 LAB — TROPONIN I
Troponin I: <0.03 ng/mL (ref <0.03)
Troponin I: <0.03 ng/mL (ref <0.03)

## 2016-11-29 MED ORDER — FUROSEMIDE 10 MG/ML IJ SOLN
60.0000 mg | Freq: Two times a day (BID) | INTRAMUSCULAR | Status: DC
  Administered 2016-11-29 – 2016-12-01 (×4): 60 mg via INTRAVENOUS
  Filled 2016-11-29 (×4): qty 6

## 2016-11-29 MED ORDER — LEVALBUTEROL HCL 1.25 MG/0.5ML IN NEBU: 1.2500 mg | INHALATION_SOLUTION | Freq: Four times a day (QID) | RESPIRATORY_TRACT | Status: DC | PRN

## 2016-11-29 NOTE — Care Management Obs Status (Signed)
MEDICARE OBSERVATION STATUS NOTIFICATION   Patient Details  Name: Shane SpenceHoward C Stopher MRN: [id number] Date of Birth: 10/31/1940   Medicare Observation Status Notification Given:  Yes    Malcolm MetroChildress, Kristiana Jacko Demske, RN 11/29/2016, 1:41 PM

## 2016-11-29 NOTE — Progress Notes (Signed)
  Echocardiogram 2D Echocardiogram has been performed.  Stephan Draughn L Androw 11/29/2016, 3:35 PM

## 2016-11-29 NOTE — Care Management Note (Signed)
Case Management Note  Patient Details  Name: Shane SpenceHoward C Morford MRN: [id number] Date of Birth: 01/09/1941  Subjective/Objective:                  Pt admitted with rep failure. He is sleepy but able to communicate. He says he lives alone, has a son and daughter who are supportive. He uses a cane with ambulation has a neb machine and home oxygen he uses prn through [employer]. He has has HH in the past but is not active pta. [facility] He has PCP and drives himself to appointments. He get his medication through the mail. He has insurance with drug coverage. He communicates no needs.    Action/Plan: CM will cont to follow to ensure no needs at DC.  Expected Discharge Date:       11/30/16          Expected Discharge Plan:  Home/Self Care  In-House Referral:  NA  Discharge planning Services  CM Consult  Post Acute Care Choice:  NA Choice offered to:  NA  Status of Service:  In process, will continue to follow    Malcolm MetroChildress, Tarahji Ramthun Demske, RN 11/29/2016, 1:42 PM

## 2016-11-29 NOTE — Evaluation (Signed)
Physical Therapy Evaluation Patient Details Name: Shane Thornton MRN: [id number] DOB: 04/11/1941 Today's Date: 11/29/2016   History of Present Illness  Shane Thornton is a 76 y.o. male with medical history significant of PVD; Parkinson's; GERD; HTN; HLD; Bipolar; dementia; CHF; and tobacco dependence presenting with chest pain and SOB.  "I wouldn't getting my breath".  He also reports that his chest was hurting.  He is not sure how long this has been going on, several months.  Chest pain comes and goes.  He was hospitalized in January for this problem.  Worse with exertion.  Better with rest or "I just have to wait till it quits."  No LE edema.  ?wheezing.  +cough - productive of "some phlegm, yellowish-green".   No fevers.  Clinical Impression  Noted high K+ levels and spoke to RN, who reports that patient is safe to participate with PT today. Attempted eval in early afternoon but patient receiving [place] and unavailable. Returned later, patient very tired and states he did not sleep well last night but wants to put his underwear on, became agreeable to completing PT eval after donning this garment. Patient is Mod(I) with all functional mobility however demonstrates quite a bit of functional weakness in B LEs and core. Able to ambulate within room on 3LPM O2 with O2 sats staying WNL, however patient then requested to ambulate in hallway without O2, stating "I only use this at night at home, I don't need it now". Ambulated approximately 5f with quad cane on room air, however upon return to room O2 had dropped to 82% and only increased to 88-89% on room air before PT replaced 3LPM O2 per nasal cannula back on patient. RPE score 0/10 with ambulation in hallway but mild accessory breathing pattern noted. RN aware of O2 drop on room air as well as patient request for pads for nasal cannula on his ears. Patient left sidelying in bed with all needs otherwise met, 3LPM O2 nasal cannula, and call bell within reach. We  will continue to work with this patient during his stay in this facility; recommend HHPT moving forward.     Follow Up Recommendations Home health PT    Equipment Recommendations  None recommended by PT    Recommendations for Other Services       Precautions / Restrictions Precautions Precautions: None Restrictions Weight Bearing Restrictions: No      Mobility  Bed Mobility Overal bed mobility: Independent                Transfers Overall transfer level: Modified independent                  Ambulation/Gait Ambulation/Gait assistance: Modified independent (Device/Increase time) Ambulation Distance (Feet): 60 Feet Assistive device: Quad cane Gait Pattern/deviations: WFL(Within Functional Limits)     General Gait Details: ambulated in room with cane and on 3LPM O2; O2 stayed WNL. Patient then requested to walk in hallway without O2 "I don't use this during the day at home", noting O2 drop to 82% on RA with gait approx 643f  Stairs            Wheelchair Mobility    Modified Rankin (Stroke Patients Only)       Balance Overall balance assessment: Modified Independent;History of Falls  Pertinent Vitals/Pain Pain Assessment: No/denies pain    Home Living Family/patient expects to be discharged to:: Private residence Living Arrangements: Alone Available Help at Discharge: Family;Available PRN/intermittently Type of Home: House Home Access: Stairs to enter Entrance Stairs-Rails: Right Entrance Stairs-Number of Steps: 3 Home Layout: One level Home Equipment: Cane - quad;Shower seat      Prior Function Level of Independence: Independent         Comments: Quad cane during ambulation     Hand Dominance   Dominant Hand: Right    Extremity/Trunk Assessment        Lower Extremity Assessment Lower Extremity Assessment: Generalized weakness    Cervical / Trunk  Assessment Cervical / Trunk Assessment: Kyphotic  Communication   Communication: No difficulties  Cognition Arousal/Alertness: Awake/alert Behavior During Therapy: WFL for tasks assessed/performed Overall Cognitive Status: Within Functional Limits for tasks assessed                                        General Comments General comments (skin integrity, edema, etc.): no significant balance concerns noted with physical performance today however note he does have hx of falls due to neuropathy     Exercises     Assessment/Plan    PT Assessment Patient needs continued PT services  PT Problem List Decreased strength;Decreased safety awareness;Decreased coordination;Decreased balance       PT Treatment Interventions DME instruction;Therapeutic activities;Gait training;Therapeutic exercise;Patient/family education;Stair training;Balance training;Functional mobility training;Neuromuscular re-education    PT Goals (Current goals can be found in the Care Plan section)  Acute Rehab PT Goals Patient Stated Goal: to go home  PT Goal Formulation: With patient Time For Goal Achievement: 12/13/16 Potential to Achieve Goals: Good    Frequency Min 3X/week   Barriers to discharge        Co-evaluation               AM-PAC PT "6 Clicks" Daily Activity  Outcome Measure Difficulty turning over in bed (including adjusting bedclothes, sheets and blankets)?: None Difficulty moving from lying on back to sitting on the side of the bed? : None Difficulty sitting down on and standing up from a chair with arms (e.g., wheelchair, bedside commode, etc,.)?: None Help needed moving to and from a bed to chair (including a wheelchair)?: None Help needed walking in hospital room?: None Help needed climbing 3-5 steps with a railing? : A Little 6 Click Score: 23    End of Session Equipment Utilized During Treatment: Oxygen Activity Tolerance: Patient tolerated treatment  well Patient left: in bed;with call bell/phone within reach Nurse Communication: Other (comment) (O2 sats dropped to 82% on room air; patient request for pads for nasal cannula as they are hurting the backs of his ears ) PT Visit Diagnosis: Unsteadiness on feet (R26.81);Muscle weakness (generalized) (M62.81);History of falling (Z91.81)    Time: 1535-1600 PT Time Calculation (min) (ACUTE ONLY): 25 min   Charges:   PT Evaluation $PT Eval Low Complexity: 1 Procedure PT Treatments $Self Care/Home Management: 8-22   PT G Codes:   PT G-Codes **NOT FOR INPATIENT CLASS** Functional Assessment Tool Used: AM-PAC 6 Clicks Basic Mobility;Clinical judgement Functional Limitation: Mobility: Walking and moving around Mobility: Walking and Moving Around Current Status (Z8588): At least 20 percent but less than 40 percent impaired, limited or restricted Mobility: Walking and Moving Around Goal Status [phone]): At least 1 percent but less than 20  percent impaired, limited or restricted    Deniece Ree PT, DPT [phone]

## 2016-11-29 NOTE — Progress Notes (Signed)
Nutrition Brief Note  RD consulted as part of COPD Gold Protocol order set.   Wt Readings from Last 15 Encounters:  11/29/16 285 lb 0.9 oz (129.3 kg)  06/06/16 271 lb (122.9 kg)    Body mass index is 38.66 kg/m. Patient meets criteria for Obese based on current BMI.   Patient seen up in bed eating lunch. He appears to be quite spacey. Unsure of baseline.   He reports that his appetite is "pretty good". He denies any N/V/C/D. He is a poor historian and could not really comment on how he was eating PTA.   When RD brought up his weight gain, he stated he had stopped smoking for 4 months and thought this might had something to do with this. He does not believe he ate anymore during this time though. He does not believe he has any increased swelling  At this time, his breathing is improving. His only other complaint is chest pain, which appears to be a long standing complaint that has been worked up in past.   Current diet order is Heart Healthy. There is no documented meal intake at this time. He says he currently has a good appetite and denies any barriers to intake  RD offered to take food preferences. No further nutrition interventions warranted at this time. If nutrition issues arise, please consult RD.   Shane LouisNathan Thornton RD, LDN, CNSC Clinical Nutrition Pager: [phone] 11/29/2016 12:50 PM

## 2016-11-29 NOTE — Clinical Social Work Note (Signed)
Patient does not meet COPD Gold protocol. Patient has not had three admissions in the past six months.    LCSW signing off.      Chadd Tollison, Juleen ChinaHeather D, LCSW

## 2016-11-29 NOTE — Progress Notes (Addendum)
[facility] PROGRESS NOTE  Shane Thornton ZOX:[id number] DOB: 15-Sep-1940 DOA: 11/28/2016   PCP: Aniceto Boss, MD     Assessment/Plan: Principal Problem:   Acute respiratory failure with hypoxia (HCC) Active Problems:   CHF (congestive heart failure) (HCC)   Tobacco abuse   Essential hypertension   Dementia   Bipolar 1 disorder (HCC)   COPD with acute exacerbation (HCC)   Normocytic anemia   Acute on chronic systolic (congestive) heart failure (HCC)   76 y.o. male who presents for ongoing assessment and management of peripheral vascular disease, status post right carotid artery stent, with multiple admissions to different hospitals in the setting of COPD exacerbation and chest pain. Other history includes anxiety, bipolar, dementia, dyslipidemia, and hypertension. Also ongoing tobacco abuse, recently discharged on 05/13/16 after negative cardiac workup. echo revealed EF of 40-45% with inferior inferior septal hypokinesis. Patient now admitted for CHF exacerbation  Assessment and plan Acute respiratory failure secondary to CHF/COPD exacerbation  -Patient also with long-standing significant smoking history and apparent COPD on CXR;   .  Acute on chronic systolic CHF exacerbation Most recent echo shows EF of 40-45% - -CXR indicates mild to moderate pulmonary edema - Admitted to inpatient, diuresed with IV Lasix, increase dose to 60 mg IV twice a day -Will request repeat echocardiogram - prior Echo in 1/18 with EF 40-45%, will check for decline and/or wall motion abnormality to determine if cardiology needs to consult on patient -Continue Plavix and Imdur, Lisinopril and Toprol  Follow renal function closely  Acute COPD exacerbation Continue Spiriva and Brovana. Status post receiving IV Solu-Medrol. Don't think steroids need to be continued  Suspect primarily CHF exacerbation Continue nebs Change albuterol to Xopenex  HTN -Continue ACE and  beta-blocker  Dementia/bipolar/Parkinson's -Continue home medications - Sinemet, Klonopin, Pristiq (changed to Effexor as inpatient for formulary reasons), Neurontin, Zyprexa, Norco    Anemia -Hgb 10.7, prior 12.8 on 1/5 - normocytic -Possibly chronic disease -Denies bleeding -Will test stools for occult blood  Tobacco dependence  Currently an active smoker -Encourage cessation.  This was discussed with the patient and should be reviewed on an ongoing basis.   -Patch ordered     DVT prophylaxsis Lovenox  Code Status:  Full code   Family Communication: Discussed in detail with the patient, all imaging results, lab results explained to the patient   Disposition Plan:   1-2 days      Consultants:  None  Procedures:  None  Antibiotics: Anti-infectives    None         HPI/Subjective: Sob has improved, no cp  Objective: Vitals:   11/28/16 2133 11/28/16 2324 11/29/16 0510 11/29/16 0827  BP: 109/73  133/62   Pulse: 83  65   Resp:   20   Temp: 98.9 F (37.2 C)  98.7 F (37.1 C)   TempSrc: Oral  Oral   SpO2: 95% 97% 98% 98%  Weight:   129.3 kg (285 lb 0.9 oz)   Height:        Intake/Output Summary (Last 24 hours) at 11/29/16 1023 Last data filed at 11/28/16 1846  Gross per 24 hour  Intake                0 ml  Output              300 ml  Net             -300 ml  Exam:  Examination:  General exam: Appears calm and comfortable  Respiratory system: Clear to auscultation. Respiratory effort normal. Cardiovascular system: S1 & S2 heard, RRR. No JVD, murmurs, rubs, gallops or clicks. 1 + pedal edema. Gastrointestinal system: Abdomen is nondistended, soft and nontender. No organomegaly or masses felt. Normal bowel sounds heard. Central nervous system: Alert and oriented. No focal neurological deficits. Extremities: Symmetric 5 x 5 power. Skin: No rashes, lesions or ulcers Psychiatry: Judgement and insight appear normal. Mood & affect  appropriate.     Data Reviewed: I have personally reviewed following labs and imaging studies  Micro Results No results found for this or any previous visit (from the past 240 hour(s)).  Radiology Reports Dg Chest 2 View  Result Date: 11/28/2016 CLINICAL DATA:  Acute onset of chest pain and shortness of breath that began this morning. Hypoxemia with oxygen saturation 80% on room air. Patient requires oxygen at night. EXAM: CHEST  2 VIEW COMPARISON:  05/11/2016. FINDINGS: Cardiac silhouette moderately enlarged. Thoracic aorta atherosclerotic, unchanged. Hilar and mediastinal contours otherwise unremarkable. Mild to moderate diffuse interstitial pulmonary edema, superimposed upon the baseline changes of emphysema and interstitial fibrosis. No confluent airspace consolidation. Densely calcified granuloma in the right mid lung as noted previously. Possible small right pleural effusion. Degenerative changes involving the thoracic spine. IMPRESSION: 1. 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 2. Aortic Atherosclerosis (ICD10-170.0) 3.  Emphysema. ([id number](ICD10-J43.9) Electronically Signed   By: Hulan Saashomas  Lawrence M.D.   On: 11/28/2016 16:58     CBC  Recent Labs Lab 11/28/16 1605 11/28/16 1617 11/29/16 0445  WBC 5.9  --  3.5*  HGB 10.7*  --  11.9*  HCT 33.6*  --  36.8*  PLT 138*  --  133*  MCV 99.7  --  98.9  MCH 31.8  --  32.0  MCHC 31.8  --  32.3  RDW 13.7  --  13.4  LYMPHSABS  --  1.1 0.4*  MONOABS  --  0.4 0.1  EOSABS  --  0.0 0.0  BASOSABS  --  0.0 0.0    Chemistries   Recent Labs Lab 11/28/16 1605 11/29/16 0445  NA 131* 136  K 3.7 5.3*  CL 93* 97*  CO2 30 28  GLUCOSE 96 159*  BUN 10 12  CREATININE 0.98 1.07  CALCIUM 8.3* 8.4*   ------------------------------------------------------------------------------------------------------------------ estimated creatinine  clearance is 84.2 mL/min (by C-G formula based on SCr of 1.07 mg/dL). ------------------------------------------------------------------------------------------------------------------ No results for input(s): HGBA1C in the last 72 hours. ------------------------------------------------------------------------------------------------------------------ No results for input(s): CHOL, HDL, LDLCALC, TRIG, CHOLHDL, LDLDIRECT in the last 72 hours. ------------------------------------------------------------------------------------------------------------------ No results for input(s): TSH, T4TOTAL, T3FREE, THYROIDAB in the last 72 hours.  Invalid input(s): FREET3 ------------------------------------------------------------------------------------------------------------------ No results for input(s): VITAMINB12, FOLATE, FERRITIN, TIBC, IRON, RETICCTPCT in the last 72 hours.  Coagulation profile No results for input(s): INR, PROTIME in the last 168 hours.  No results for input(s): DDIMER in the last 72 hours.  Cardiac Enzymes  Recent Labs Lab 11/28/16 1605 11/28/16 2207 11/29/16 0445  TROPONINI <0.03 <0.03 <0.03   ------------------------------------------------------------------------------------------------------------------ Invalid input(s): POCBNP   CBG: No results for input(s): GLUCAP in the last 168 hours.     Studies: Dg Chest 2 View  Result Date: 11/28/2016 CLINICAL DATA:  Acute onset of chest pain and shortness of breath that began this morning. Hypoxemia with oxygen saturation 80% on room air. Patient requires oxygen at night. EXAM: CHEST  2 VIEW  COMPARISON:  05/11/2016. FINDINGS: Cardiac silhouette moderately enlarged. Thoracic aorta atherosclerotic, unchanged. Hilar and mediastinal contours otherwise unremarkable. Mild to moderate diffuse interstitial pulmonary edema, superimposed upon the baseline changes of emphysema and interstitial fibrosis. No confluent airspace  consolidation. Densely calcified granuloma in the right mid lung as noted previously. Possible small right pleural effusion. Degenerative changes involving the thoracic spine. IMPRESSION: 1. 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 2. Aortic Atherosclerosis (ICD10-170.0) 3.  Emphysema. (AOZ30-Q65.9) Electronically Signed   By: Hulan Saas M.D.   On: 11/28/2016 16:58      Lab Results  Component Value Date   HGBA1C 5.5 05/11/2016   Lab Results  Component Value Date   LDLCALC 65 05/11/2016   CREATININE 1.07 11/29/2016       Scheduled Meds: . aspirin  325 mg Oral Daily  . carbidopa-levodopa  1 tablet Oral BID  . clopidogrel  75 mg Oral Daily  . enoxaparin (LOVENOX) injection  40 mg Subcutaneous Q24H  . finasteride  5 mg Oral Daily  . furosemide  60 mg Intravenous BID  . gabapentin  600 mg Oral QID  . HYDROcodone-acetaminophen  1 tablet Oral Q6H  . ipratropium-albuterol  3 mL Nebulization Q6H  . isosorbide mononitrate  30 mg Oral Daily  . lisinopril  2.5 mg Oral Daily  . metoprolol succinate  12.5 mg Oral Daily  . nicotine  21 mg Transdermal Daily  . OLANZapine  10 mg Oral QHS  . pantoprazole  80 mg Oral Daily  . potassium chloride SA  20 mEq Oral Daily  . rosuvastatin  20 mg Oral QHS  . sodium chloride flush  3 mL Intravenous Q12H  . sucralfate  1 g Oral QID  . tamsulosin  0.4 mg Oral Daily  . venlafaxine XR  150 mg Oral Q breakfast  . vitamin C  250 mg Oral Daily   Continuous Infusions: . sodium chloride       LOS: 0 days    Time spent: >30 MINS    Richarda Overlie  [facility] Pager [phone]. If 7PM-7AM, please contact night-coverage at [email or web address], password [facility] 11/29/2016, 10:23 AM  LOS: 0 days

## 2016-11-30 DIAGNOSIS — F319 Bipolar disorder, unspecified: Secondary | ICD-10-CM

## 2016-11-30 DIAGNOSIS — I509 Heart failure, unspecified: Secondary | ICD-10-CM

## 2016-11-30 DIAGNOSIS — J9601 Acute respiratory failure with hypoxia: Secondary | ICD-10-CM

## 2016-11-30 LAB — COMPREHENSIVE METABOLIC PANEL
ALT: <5 U/L — ABNORMAL LOW (ref 17–63)
ANION GAP: 8 (ref 5–15)
AST: 9 U/L — ABNORMAL LOW (ref 15–41)
Albumin: 3.6 g/dL (ref 3.5–5.0)
Alkaline Phosphatase: 55 U/L (ref 38–126)
BUN: 19 mg/dL (ref 6–20)
CHLORIDE: 95 mmol/L — AB (ref 101–111)
CO2: 36 mmol/L — AB (ref 22–32)
CREATININE: 0.96 mg/dL (ref 0.61–1.24)
Calcium: 8.6 mg/dL — ABNORMAL LOW (ref 8.9–10.3)
GFR calc non Af Amer: >60 mL/min (ref >60)
Glucose, Bld: 101 mg/dL — ABNORMAL HIGH (ref 65–99)
POTASSIUM: 3.9 mmol/L (ref 3.5–5.1)
SODIUM: 139 mmol/L (ref 135–145)
Total Bilirubin: 0.6 mg/dL (ref 0.3–1.2)
Total Protein: 6.2 g/dL — ABNORMAL LOW (ref 6.5–8.1)

## 2016-11-30 NOTE — Progress Notes (Signed)
Patient did not tolerate being on 1L O2. Titrated patient back to 2.5L. Patient is tolerating.  Genelle Balameron D Bobbie, RN

## 2016-11-30 NOTE — Progress Notes (Signed)
RN titrated [place] O2 to 2L, patient saturation 96 after 30 mins while lying in bed. Titrated to 1L. Will Monitor saturation.  Genelle Balameron D Pervis, RN

## 2016-11-30 NOTE — Progress Notes (Signed)
Patient wanted to take a shower. Patient took shower without wearing O2 to see how he would feel. Before shower and on 2.5L O2 patient sats were 96. After shower, and after patient had been off O2 for approx 30mins, pt's sats were 80-83. He stated he felt fine, just a little SOB. Patient placed back on 2.5L O2.  Genelle Balameron D Torsten, RN

## 2016-11-30 NOTE — Care Management (Signed)
CM met with pt and daughter at the bedside. Pt would like [facility] [phone]. And will need cont home oxygen set up through [place] as he only has HS pta. [facility] [phone]) Pt's daughter requesting port concentrator on DC from hospital. CM explained that pt must DC with larger tank and could be upgraded to port concentrator at later time. This will need to be verified prior to DC. Anticipate DC home tomorrow. Pt will need home oxygen assessment prior to DC. CM will cont to follow to facilitate DC needs.

## 2016-11-30 NOTE — Evaluation (Signed)
Occupational Therapy Evaluation Patient Details Name: Shane SpenceHoward C Swiger MRN: [id number] DOB: 12/24/1940 Today's Date: 11/30/2016    History of Present Illness Shane Thornton is a 76 y.o. male with medical history significant of PVD; Parkinson's; GERD; HTN; HLD; Bipolar; dementia; CHF; and tobacco dependence presenting with chest pain and SOB.  "I wouldn't getting my breath".  He also reports that his chest was hurting.  He is not sure how long this has been going on, several months.  Chest pain comes and goes.  He was hospitalized in January for this problem.  Worse with exertion.  Better with rest or "I just have to wait till it quits."  No LE edema.  ?wheezing.  +cough - productive of "some phlegm, yellowish-green".   No fevers.   Clinical Impression   Pt received sitting up in bed, eating breakfast, marginally agreeable to OT evaluation. Pt demonstrates mod independence with ADL completion, increased time required. PTA pt independent, daughter lives next door and provides meals. BUE strength is WFL, occasionally experiences tremors due to Parkinson's disease, however no tremors noted during evaluation. Pt is at baseline with B/ADL completion, no further OT services required a this time.     Follow Up Recommendations  No OT follow up;Supervision - Intermittent    Equipment Recommendations  None recommended by OT       Precautions / Restrictions Precautions Precautions: None Restrictions Weight Bearing Restrictions: No      Mobility Bed Mobility Overal bed mobility: Independent                Transfers Overall transfer level: Modified independent                        ADL either performed or assessed with clinical judgement   ADL Overall ADL's : Modified independent;At baseline                                       General ADL Comments: Increased time for completion, uses shower seat at home for bathing.      Vision Baseline Vision/History:  No visual deficits Patient Visual Report: No change from baseline Vision Assessment?: No apparent visual deficits            Pertinent Vitals/Pain Pain Assessment: No/denies pain     Hand Dominance Right   Extremity/Trunk Assessment Upper Extremity Assessment Upper Extremity Assessment: Overall WFL for tasks assessed   Lower Extremity Assessment Lower Extremity Assessment: Defer to PT evaluation       Communication Communication Communication: No difficulties   Cognition Arousal/Alertness: Awake/alert Behavior During Therapy: WFL for tasks assessed/performed Overall Cognitive Status: Within Functional Limits for tasks assessed                                                Home Living Family/patient expects to be discharged to:: Private residence Living Arrangements: Alone Available Help at Discharge: Family;Available PRN/intermittently Type of Home: House Home Access: Stairs to enter Entergy CorporationEntrance Stairs-Number of Steps: 3 Entrance Stairs-Rails: Right Home Layout: One level     Bathroom Shower/Tub: Tub/shower unit;Walk-in shower     Bathroom Accessibility: Yes   Home Equipment: Cane - quad;Shower seat          Prior Functioning/Environment Level  of Independence: Independent with assistive device(s)        Comments: Quad cane during ambulation        OT Problem List: Decreased activity tolerance       AM-PAC PT "6 Clicks" Daily Activity     Outcome Measure Help from another person eating meals?: A Little Help from another person taking care of personal grooming?: None Help from another person toileting, which includes using toliet, bedpan, or urinal?: None Help from another person bathing (including washing, rinsing, drying)?: None Help from another person to put on and taking off regular upper body clothing?: None Help from another person to put on and taking off regular lower body clothing?: None 6 Click Score: 23   End of  Session    Activity Tolerance: Patient tolerated treatment well Patient left: in bed;with call bell/phone within reach  OT Visit Diagnosis: Muscle weakness (generalized) (M62.81)                Time: [id number] OT Time Calculation (min): 23 min Charges:  OT General Charges $OT Visit: 1 Procedure OT Evaluation $OT Eval Low Complexity: 1 Procedure   Shane Thornton, OTR/L  [phone] 11/30/2016, 9:15 AM

## 2016-11-30 NOTE — Progress Notes (Signed)
PROGRESS NOTE    Shane SpenceHoward C Thornton  ZOX:[id number] DOB: 03/16/1941 DOA: 11/28/2016 PCP: Aniceto BossSettle, Paul C, MD     Brief Narrative:  76 year old man admitted to the hospital on 7/23 due to shortness of breath. Has a history of systolic CHF and COPD. Has been admitted for treatment of both with acute hypoxemic respiratory failure.   Assessment & Plan:   Principal Problem:   Acute respiratory failure with hypoxia (HCC) Active Problems:   CHF (congestive heart failure) (HCC)   Tobacco abuse   Essential hypertension   Dementia   Bipolar 1 disorder (HCC)   COPD with acute exacerbation (HCC)   Normocytic anemia   Acute on chronic systolic (congestive) heart failure (HCC)   Acute hypoxemic respiratory failure -Due to combination of acute systolic CHF and COPD with acute exacerbation. -Will likely need oxygen upon discharge.  COPD with acute exacerbation -Has completed steroid course. Continue Spiriva and Brovana.  Acute on chronic systolic CHF -Most recent echo with ejection fraction of 40-45%. Chest x-ray with mild to moderate pulmonary edema. -Has diuresed a total of 1 L of fluids, continue current dose of Lasix. -Appears symptomatically improved. -Is on lisinopril, metoprolol, Plavix, Imdur.  Tobacco abuse -Counseled on cessation.      DVT prophylaxis: Lovenox Code Status: Full code Family Communication: daughter at bedside Disposition Plan: anticipate DC home in 24 hours  Consultants:   None  Procedures:   None  Antimicrobials:  Anti-infectives    None       Subjective: Is much improved, less short of breath, concerned about having to carry a large oxygen tank at home  if he is discharged on oxygen.   Objective: Vitals:   11/30/16 0500 11/30/16 0809 11/30/16 1037 11/30/16 1411  BP: (!) 111/41  (!) 129/52 (!) 123/58  Pulse: 60  64 70  Resp: 20   16  Temp: 97.6 F (36.4 C)   98.5 F (36.9 C)  TempSrc: Oral   Oral  SpO2: 95% 97%  94%  Weight: 127.1  kg (280 lb 3.3 oz)     Height:        Intake/Output Summary (Last 24 hours) at 11/30/16 1605 Last data filed at 11/30/16 1045  Gross per 24 hour  Intake              500 ml  Output              600 ml  Net             -100 ml   Filed Weights   11/28/16 2132 11/29/16 0510 11/30/16 0500  Weight: 129.7 kg (285 lb 15 oz) 129.3 kg (285 lb 0.9 oz) 127.1 kg (280 lb 3.3 oz)    Examination:  General exam: Alert, awake, oriented x 3 Respiratory system: Clear to auscultation. Respiratory effort normal. Cardiovascular system:RRR. No murmurs, rubs, gallops. Gastrointestinal system: Abdomen is nondistended, soft and nontender. No organomegaly or masses felt. Normal bowel sounds heard. Central nervous system: Alert and oriented. No focal neurological deficits. Extremities: No C/C/E, +pedal pulses Skin: No rashes, lesions or ulcers Psychiatry: Judgement and insight appear normal. Mood & affect appropriate.     Data Reviewed: I have personally reviewed following labs and imaging studies  CBC:  Recent Labs Lab 11/28/16 1605 11/28/16 1617 11/29/16 0445  WBC 5.9  --  3.5*  NEUTROABS  --  4.3 3.1  HGB 10.7*  --  11.9*  HCT 33.6*  --  36.8*  MCV 99.7  --  98.9  PLT 138*  --  133*   Basic Metabolic Panel:  Recent Labs Lab 11/28/16 1605 11/29/16 0445 11/30/16 0416  NA 131* 136 139  K 3.7 5.3* 3.9  CL 93* 97* 95*  CO2 30 28 36*  GLUCOSE 96 159* 101*  BUN 10 12 19   CREATININE 0.98 1.07 0.96  CALCIUM 8.3* 8.4* 8.6*   GFR: Estimated Creatinine Clearance: 93 mL/min (by C-G formula based on SCr of 0.96 mg/dL). Liver Function Tests:  Recent Labs Lab 11/30/16 0416  AST 9*  ALT <5*  ALKPHOS 55  BILITOT 0.6  PROT 6.2*  ALBUMIN 3.6   No results for input(s): LIPASE, AMYLASE in the last 168 hours. No results for input(s): AMMONIA in the last 168 hours. Coagulation Profile: No results for input(s): INR, PROTIME in the last 168 hours. Cardiac Enzymes:  Recent Labs Lab  11/28/16 1605 11/28/16 2207 11/29/16 0445 11/29/16 1035  TROPONINI <0.03 <0.03 <0.03 <0.03   BNP (last 3 results) No results for input(s): PROBNP in the last 8760 hours. HbA1C: No results for input(s): HGBA1C in the last 72 hours. CBG: No results for input(s): GLUCAP in the last 168 hours. Lipid Profile: No results for input(s): CHOL, HDL, LDLCALC, TRIG, CHOLHDL, LDLDIRECT in the last 72 hours. Thyroid Function Tests: No results for input(s): TSH, T4TOTAL, FREET4, T3FREE, THYROIDAB in the last 72 hours. Anemia Panel: No results for input(s): VITAMINB12, FOLATE, FERRITIN, TIBC, IRON, RETICCTPCT in the last 72 hours. Urine analysis: No results found for: COLORURINE, APPEARANCEUR, LABSPEC, PHURINE, GLUCOSEU, HGBUR, BILIRUBINUR, KETONESUR, PROTEINUR, UROBILINOGEN, NITRITE, LEUKOCYTESUR Sepsis Labs: @LABRCNTIP (procalcitonin:4,lacticidven:4)  )No results found for this or any previous visit (from the past 240 hour(s)).       Radiology Studies: Dg Chest 2 View  Result Date: 11/28/2016 CLINICAL DATA:  Acute onset of chest pain and shortness of breath that began this morning. Hypoxemia with oxygen saturation 80% on room air. Patient requires oxygen at night. EXAM: CHEST  2 VIEW COMPARISON:  05/11/2016. FINDINGS: Cardiac silhouette moderately enlarged. Thoracic aorta atherosclerotic, unchanged. Hilar and mediastinal contours otherwise unremarkable. Mild to moderate diffuse interstitial pulmonary edema, superimposed upon the baseline changes of emphysema and interstitial fibrosis. No confluent airspace consolidation. Densely calcified granuloma in the right mid lung as noted previously. Possible small right pleural effusion. Degenerative changes involving the thoracic spine. IMPRESSION: 1. 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 2. Aortic Atherosclerosis (ICD10-170.0) 3.  Emphysema.  (ZOX09-U04.9) Electronically Signed   By: Hulan Saas M.D.   On: 11/28/2016 16:58        Scheduled Meds: . aspirin  325 mg Oral Daily  . carbidopa-levodopa  1 tablet Oral BID  . clopidogrel  75 mg Oral Daily  . enoxaparin (LOVENOX) injection  40 mg Subcutaneous Q24H  . finasteride  5 mg Oral Daily  . furosemide  60 mg Intravenous BID  . gabapentin  600 mg Oral QID  . HYDROcodone-acetaminophen  1 tablet Oral Q6H  . ipratropium-albuterol  3 mL Nebulization Q6H  . isosorbide mononitrate  30 mg Oral Daily  . lisinopril  2.5 mg Oral Daily  . metoprolol succinate  12.5 mg Oral Daily  . nicotine  21 mg Transdermal Daily  . OLANZapine  10 mg Oral QHS  . pantoprazole  80 mg Oral Daily  . rosuvastatin  20 mg Oral QHS  . sodium chloride flush  3 mL Intravenous Q12H  . sucralfate  1 g Oral  QID  . tamsulosin  0.4 mg Oral Daily  . venlafaxine XR  150 mg Oral Q breakfast  . vitamin C  250 mg Oral Daily   Continuous Infusions: . sodium chloride       LOS: 1 day    Time spent: 35 minutes. Greater than 50% of this time was spent in direct contact with the patient coordinating care.     Chaya JanHERNANDEZ ACOSTA,Karra Pink, MD [facility] Pager [phone]  If 7PM-7AM, please contact night-coverage [email or web address] Password [email or web address] 11/30/2016, 4:05 PM

## 2016-12-01 DIAGNOSIS — I5023 Acute on chronic systolic (congestive) heart failure: Secondary | ICD-10-CM

## 2016-12-01 LAB — BASIC METABOLIC PANEL
ANION GAP: 12 (ref 5–15)
BUN: 25 mg/dL — ABNORMAL HIGH (ref 6–20)
CALCIUM: 8.9 mg/dL (ref 8.9–10.3)
CO2: 36 mmol/L — ABNORMAL HIGH (ref 22–32)
Chloride: 93 mmol/L — ABNORMAL LOW (ref 101–111)
Creatinine, Ser: 1.11 mg/dL (ref 0.61–1.24)
GLUCOSE: 87 mg/dL (ref 65–99)
POTASSIUM: 3.9 mmol/L (ref 3.5–5.1)
Sodium: 141 mmol/L (ref 135–145)

## 2016-12-01 NOTE — Progress Notes (Addendum)
SATURATION QUALIFICATIONS: (This note is used to comply with regulatory documentation for home oxygen)  Patient Saturations on Room Air at Rest = 89%  Patient Saturations on Room Air while Ambulating = 80%  Patient Saturations on 3 Liters of oxygen while Ambulating = 95%  Please briefly explain why patient needs home oxygen: Patient SPO2 drops greatly when oxygen is not in use.

## 2016-12-01 NOTE — Care Management Note (Signed)
Case Management Note  Patient Details  Name: Shane SpenceHoward C Thornton MRN: [id number] Date of Birth: 06/12/1940  Expected Discharge Date:  12/01/16               Expected Discharge Plan:  Home w Home Health Services  In-House Referral:  NA  Discharge planning Services  CM Consult  Post Acute Care Choice:  Durable Medical Equipment, Home Health Choice offered to:  Patient  DME Arranged:  Oxygen D[facility]:  [facility]  HH Arranged:  RN, PT [facility]  Status of Service:  Completed, signed off  Additional Comments: Pt discharging home today with HH nursing/PT. Pt aware HH has 48hrs to make first visit. CM spoke with Zollie Scalelivia in [place] office and pt info faxed. Pt's port oxygen delivered by [facility]. Pt DC summary will be faxed to Dr. Eudelia BunchSettle's office when available. DC plan discussed with pt's daughter via phone. She will provide transportation home.   Malcolm Metrohildress, Caty Tessler Demske, RN 12/01/2016, 1:13 PM

## 2016-12-01 NOTE — Progress Notes (Signed)
SATURATION QUALIFICATIONS: (This note is used to comply with regulatory documentation for home oxygen)  Patient Saturations on Room Air at Rest = 90  Patient Saturations on Room Air while Ambulating = 80-83  Patient Saturations on 3L O2- 96 at rest  Please briefly explain why patient needs home oxygen: Patient is not able to adequately sustain O2 saturations without being on Oxygen. Patient becomes SOB while not on oxygen.  Genelle Balameron D Atiba, RN

## 2016-12-01 NOTE — Discharge Summary (Signed)
Physician Discharge Summary  Shane Thornton:[id number] DOB: 02-06-1941 DOA: 11/28/2016  PCP: Aniceto Boss, MD  Admit date: 11/28/2016 Discharge date: 12/01/2016  Time spent: 45 minutes  Recommendations for Outpatient Follow-up:  -Patient will be discharged back home today. -He has a new oxygen requirement and home O2 has been arranged by case management. -Advised to follow-up with primary care provider in 2 weeks.   Discharge Diagnoses:  Principal Problem:   Acute respiratory failure with hypoxia (HCC) Active Problems:   CHF (congestive heart failure) (HCC)   Tobacco abuse   Essential hypertension   Dementia   Bipolar 1 disorder (HCC)   COPD with acute exacerbation (HCC)   Normocytic anemia   Acute on chronic systolic (congestive) heart failure [facility] Shane Thornton - Shane Thornton)   Discharge Condition: Stable and improved  Filed Weights   11/29/16 0510 11/30/16 0500 12/01/16 0542  Weight: 129.3 kg (285 lb 0.9 oz) 127.1 kg (280 lb 3.3 oz) 125.2 kg (276 lb 0.3 oz)    History of present illness:  As per Dr. Ophelia Charter on 7/23: Shane Thornton is a 76 y.o. male with medical history significant of PVD; Parkinson's; GERD; HTN; HLD; Bipolar; dementia; CHF; and tobacco dependence presenting with chest pain and SOB.  "I wouldn't getting my breath".  He also reports that his chest was hurting.  He is not sure how long this has been going on, several months.  Chest pain comes and goes.  He was hospitalized in January for this problem.  Worse with exertion.  Better with rest or "I just have to wait till it quits."  No LE edema.  ?wheezing.  +cough - productive of "some phlegm, yellowish-green".   No fevers.  During his January hospitalization, he had a negative work-up for ACS including a negative nuclear stress test.  He was started on Imdur.  He had an echo with EF 40-45%.     ED Course: 80% on room air upon arrival.  Given IV solumedrol and Duoneb with improvement in sats to 95-97% on O2.  CHF on CXR - given  IV Lasix.  Thornton Course:   Acute hypoxemic respiratory failure -Due to combination of acute systolic CHF and COPD with acute exacerbation. -Will need oxygen upon discharge. This is been arranged.  COPD with acute exacerbation -Has completed steroid course. Continue Spiriva and Brovana.  Acute on chronic systolic CHF -Most recent echo with ejection fraction of 40-45%. Chest x-ray with mild to moderate pulmonary edema. -Has diuresed a total of 1 L of fluids, continue current dose of Lasix. -Appears symptomatically improved. -Is on lisinopril, metoprolol, Plavix, Imdur.  Tobacco abuse -Counseled on cessation.  Procedures:  None   Consultations: Pulmonary, Dr. Juanetta Gosling   Discharge Instructions  Discharge Instructions    Diet - low sodium heart healthy    Complete by:  As directed    Increase activity slowly    Complete by:  As directed      Allergies as of 12/01/2016      Reactions   Avelox [moxifloxacin Hcl In Nacl] Other (See Comments)   unknown   Ciprofloxacin Other (See Comments)   depression   Horse Chestnut [aesculus] Other (See Comments)   Childhood allergy.   Metronidazole Other (See Comments)   unknown   Papaya Derivatives Hives      Medication List    TAKE these medications   albuterol (2.5 MG/3ML) 0.083% nebulizer solution Commonly known as:  PROVENTIL Take 2.5 mg by nebulization daily as needed for  wheezing or shortness of breath.   albuterol 108 (90 Base) MCG/ACT inhaler Commonly known as:  PROVENTIL HFA;VENTOLIN HFA Inhale 2 puffs into the lungs every 4 (four) hours as needed for wheezing or shortness of breath.   arformoterol 15 MCG/2ML Nebu Commonly known as:  BROVANA Take 15 mcg by nebulization 2 (two) times daily.   carbidopa-levodopa 50-200 MG tablet Commonly known as:  SINEMET CR Take 1 tablet by mouth 2 (two) times daily. This dose is administered in the morning and in the evening   carbidopa-levodopa 25-100 MG tablet Commonly  known as:  SINEMET IR Take 1 tablet by mouth 2 (two) times daily. This dose is administered at lunch and at bedtime   clonazePAM 1 MG tablet Commonly known as:  KLONOPIN Take 1 tablet (1 mg total) by mouth 3 (three) times daily as needed for anxiety. What changed:  when to take this   desvenlafaxine 100 MG 24 hr tablet Commonly known as:  PRISTIQ Take 100 mg by mouth every morning.   esomeprazole 40 MG capsule Commonly known as:  NEXIUM Take 40 mg by mouth 2 (two) times daily.   finasteride 5 MG tablet Commonly known as:  PROSCAR Take 5 mg by mouth daily.   furosemide 40 MG tablet Commonly known as:  LASIX Take 1 tablet (40 mg total) by mouth 2 (two) times daily.   gabapentin 400 MG capsule Commonly known as:  NEURONTIN Take 400-1,600 mg by mouth 3 (three) times daily. Take 400mg  twice daily and take 1600mg  at bedtime   HYDROcodone-acetaminophen 7.5-325 MG tablet Commonly known as:  NORCO Take 1 tablet by mouth every 6 (six) hours.   isosorbide mononitrate 30 MG 24 hr tablet Commonly known as:  IMDUR Take 1 tablet (30 mg total) by mouth daily.   lisinopril 2.5 MG tablet Commonly known as:  PRINIVIL,ZESTRIL Take 1 tablet (2.5 mg total) by mouth daily.   loratadine 10 MG tablet Commonly known as:  CLARITIN Take 10 mg by mouth daily.   metoprolol succinate 25 MG 24 hr tablet Commonly known as:  TOPROL-XL Take 0.5 tablets (12.5 mg total) by mouth daily.   nicotine polacrilex 4 MG lozenge Commonly known as:  COMMIT Take 4 mg by mouth as needed for smoking cessation.   OLANZapine 10 MG tablet Commonly known as:  ZYPREXA Take 10 mg by mouth at bedtime.   OXYGEN Inhale 2 L into the lungs at bedtime.   potassium chloride SA 20 MEQ tablet Commonly known as:  K-DUR,KLOR-CON Take 1 tablet (20 mEq total) by mouth daily.   rOPINIRole 0.25 MG tablet Commonly known as:  REQUIP Take 0.25 mg by mouth at bedtime.   rOPINIRole 0.5 MG tablet Commonly known as:   REQUIP Take 0.5 mg by mouth at bedtime.   rosuvastatin 20 MG tablet Commonly known as:  CRESTOR Take 1 tablet (20 mg total) by mouth at bedtime.   sucralfate 1 g tablet Commonly known as:  CARAFATE Take 1 g by mouth 4 (four) times daily.   tamsulosin 0.4 MG Caps capsule Commonly known as:  FLOMAX Take 0.4 mg by mouth daily.   tiotropium 18 MCG inhalation capsule Commonly known as:  SPIRIVA Place 18 mcg into inhaler and inhale daily.   vitamin C 250 MG tablet Commonly known as:  ASCORBIC ACID Take 250 mg by mouth daily.            Durable Medical Equipment        Start  Ordered   12/01/16 0920  For home use only DME oxygen  Once    Question Answer Comment  Mode or (Route) Nasal cannula   Liters per Minute 3   Frequency Continuous (stationary and portable oxygen unit needed)   Oxygen conserving device Yes   Oxygen delivery system Gas      12/01/16 0919     Allergies  Allergen Reactions  . Avelox [Moxifloxacin Hcl In Nacl] Other (See Comments)    unknown  . Ciprofloxacin Other (See Comments)    depression  . Horse Chestnut [Aesculus] Other (See Comments)    Childhood allergy.  . Metronidazole Other (See Comments)    unknown  . Papaya Derivatives Hives   Follow-up Information    Aniceto BossSettle, Paul C, MD. Schedule an appointment as soon as possible for a visit on 12/15/2016.   Specialty:  Family Medicine Why:  app. 1:00 Contact information: [address] [place] [id number] [phone]            The results of significant diagnostics from this hospitalization (including imaging, microbiology, ancillary and laboratory) are listed below for reference.    Significant Diagnostic Studies: Dg Chest 2 View  Result Date: 11/28/2016 CLINICAL DATA:  Acute onset of chest pain and shortness of breath that began this morning. Hypoxemia with oxygen saturation 80% on room air. Patient requires oxygen at night. EXAM: CHEST  2 VIEW COMPARISON:  05/11/2016. FINDINGS:  Cardiac silhouette moderately enlarged. Thoracic aorta atherosclerotic, unchanged. Hilar and mediastinal contours otherwise unremarkable. Mild to moderate diffuse interstitial pulmonary edema, superimposed upon the baseline changes of emphysema and interstitial fibrosis. No confluent airspace consolidation. Densely calcified granuloma in the right mid lung as noted previously. Possible small right pleural effusion. Degenerative changes involving the thoracic spine. IMPRESSION: 1. 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 2. Aortic Atherosclerosis (ICD10-170.0) 3.  Emphysema. ([id number](ICD10-J43.9) Electronically Signed   By: Hulan Saashomas  Lawrence M.D.   On: 11/28/2016 16:58    Microbiology: No results found for this or any previous visit (from the past 240 hour(s)).   Labs: Basic Metabolic Panel:  Recent Labs Lab 11/28/16 1605 11/29/16 0445 11/30/16 0416 12/01/16 0411  NA 131* 136 139 141  K 3.7 5.3* 3.9 3.9  CL 93* 97* 95* 93*  CO2 30 28 36* 36*  GLUCOSE 96 159* 101* 87  BUN 10 12 19  25*  CREATININE 0.98 1.07 0.96 1.11  CALCIUM 8.3* 8.4* 8.6* 8.9   Liver Function Tests:  Recent Labs Lab 11/30/16 0416  AST 9*  ALT <5*  ALKPHOS 55  BILITOT 0.6  PROT 6.2*  ALBUMIN 3.6   No results for input(s): LIPASE, AMYLASE in the last 168 hours. No results for input(s): AMMONIA in the last 168 hours. CBC:  Recent Labs Lab 11/28/16 1605 11/28/16 1617 11/29/16 0445  WBC 5.9  --  3.5*  NEUTROABS  --  4.3 3.1  HGB 10.7*  --  11.9*  HCT 33.6*  --  36.8*  MCV 99.7  --  98.9  PLT 138*  --  133*   Cardiac Enzymes:  Recent Labs Lab 11/28/16 1605 11/28/16 2207 11/29/16 0445 11/29/16 1035  TROPONINI <0.03 <0.03 <0.03 <0.03   BNP: BNP (last 3 results)  Recent Labs  11/28/16 1617  BNP 166.0*    ProBNP (last 3 results) No results for input(s): PROBNP in the last 8760  hours.  CBG: No results for input(s): GLUCAP in the last  168 hours.     SignedChaya Jan  [facility] Pager: [phone] 12/01/2016, 3:27 PM

## 2016-12-01 NOTE — Care Management Important Message (Signed)
Important Message  Patient Details  Name: Delila SpenceHoward C Cardoza MRN: [id number] Date of Birth: 02/28/1941   Medicare Important Message Given:  Yes    Malcolm MetroChildress, Tamieka Rancourt Demske, RN 12/01/2016, 1:13 PM

## 2017-02-27 ENCOUNTER — Other Ambulatory Visit: Payer: Self-pay

## 2017-02-27 MED ORDER — FUROSEMIDE 40 MG PO TABS
40.0000 mg | ORAL_TABLET | Freq: Two times a day (BID) | ORAL | 0 refills | Status: DC
Start: 2017-02-27 — End: 2017-03-18

## 2017-02-27 NOTE — Telephone Encounter (Signed)
Pt overdue for July apt, 1 month supply given of lasix, asked to call to arrange a f/u apt

## 2017-03-18 ENCOUNTER — Other Ambulatory Visit: Payer: Self-pay | Admitting: Adult Health

## 2017-04-17 ENCOUNTER — Other Ambulatory Visit: Payer: Self-pay | Admitting: Adult Health

## 2017-05-06 ENCOUNTER — Other Ambulatory Visit: Payer: Self-pay | Admitting: Cardiology

## 2017-06-09 DEATH — deceased

## 2017-07-13 ENCOUNTER — Encounter: Payer: Self-pay | Admitting: Cardiology

## 2017-11-07 IMAGING — NM NM MYOCAR MULTI W/SPECT W/WALL MOTION & EF
2 series · 12 of 12 positions shown · non-contrast
Comparison: none

[Series 1: rest · 8.28mm/px · 6 of 64 frames shown]
[frame 6/64]
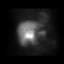
[frame 16/64]
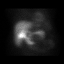
[frame 27/64]
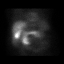
[frame 38/64]
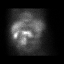
[frame 48/64]
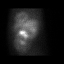
[frame 59/64]
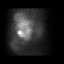

[Series 2: stress gated · 8.28mm/px · 6 of 64 frames shown]
[frame 6/64]
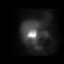
[frame 16/64]
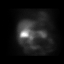
[frame 27/64]
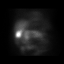
[frame 38/64]
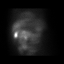
[frame 48/64]
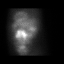
[frame 59/64]
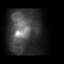

[12 of 12 positions shown; findings below may reference images not displayed]

Canned report from images found in remote index.

Refer to host system for actual result text.

## 2018-05-26 IMAGING — DX DG CHEST 2V
2 series · 2 of 2 positions shown · non-contrast
Comparison: 05/11/2016.

CLINICAL DATA: Acute onset of chest pain and shortness of breath
that began this morning. Hypoxemia with oxygen saturation 80% on
room air. Patient requires oxygen at night.

EXAM:
CHEST  2 VIEW

[chest lat]
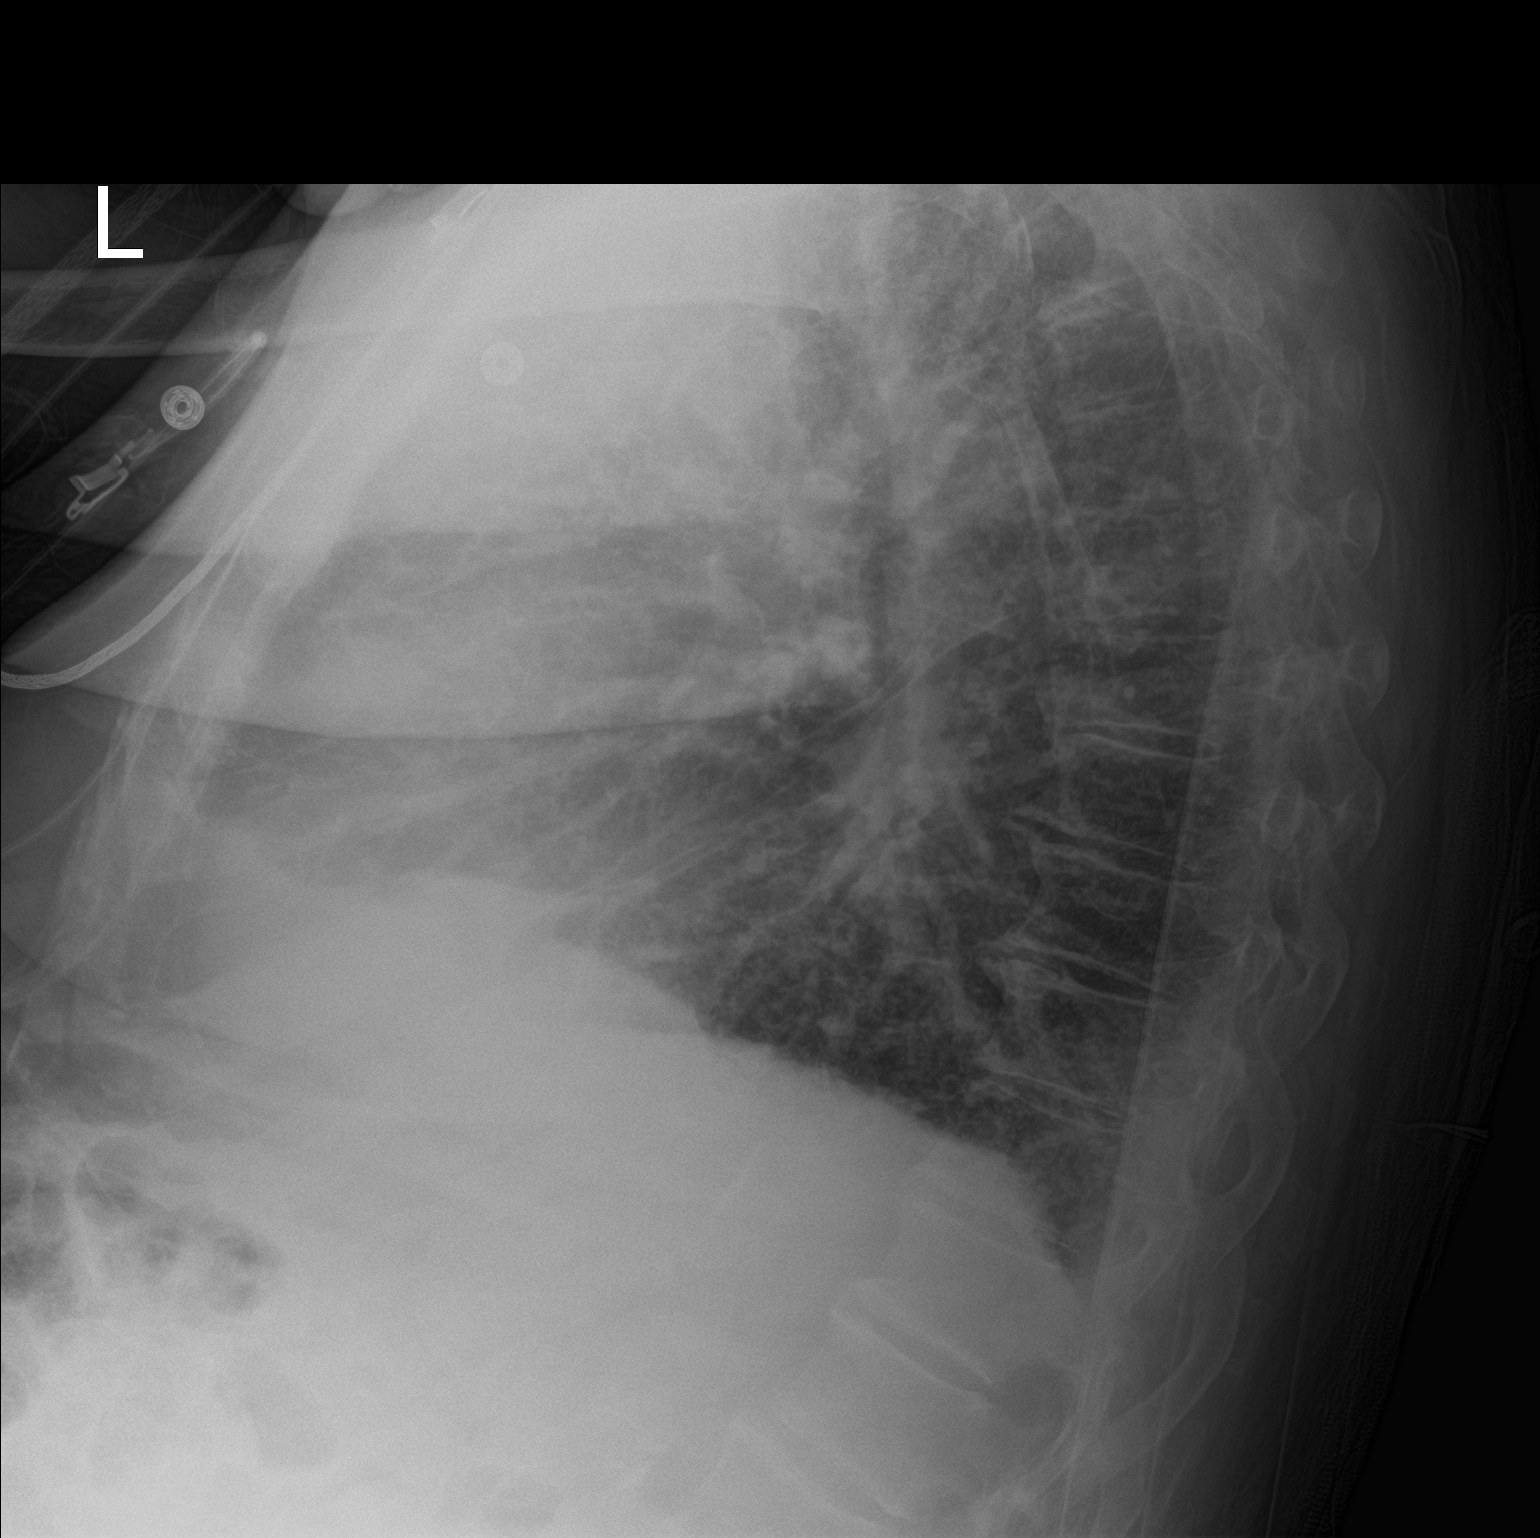

[chest ap]
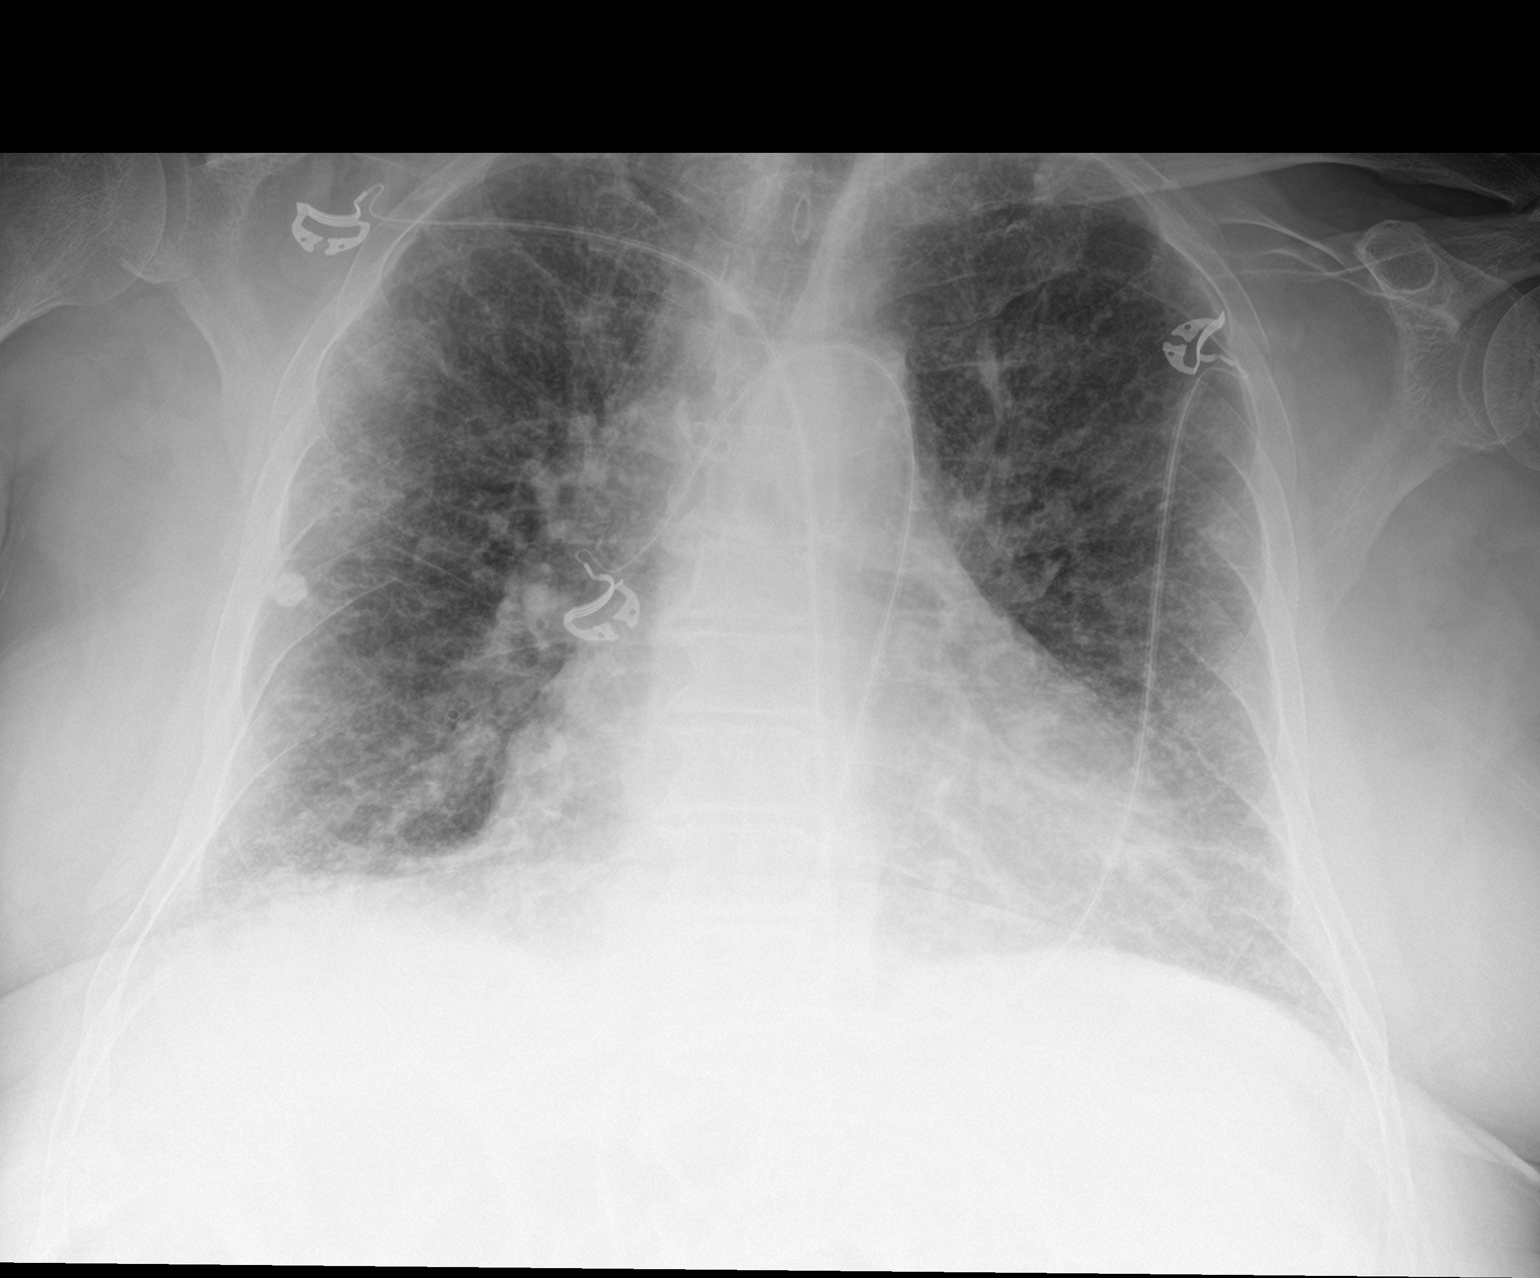

[2 of 2 positions shown; findings below may reference images not displayed]

FINDINGS: Cardiac silhouette moderately enlarged. Thoracic aorta
atherosclerotic, unchanged. Hilar and mediastinal contours otherwise
unremarkable. Mild to moderate diffuse interstitial pulmonary edema,
superimposed upon the baseline changes of emphysema and interstitial
fibrosis. No confluent airspace consolidation. Densely calcified
granuloma in the right mid lung as noted previously. Possible small
right pleural effusion. Degenerative changes involving the thoracic
spine.
IMPRESSION: 1. CHF, with mild to moderate interstitial pulmonary edema and
moderate cardiomegaly, superimposed upon baseline COPD/emphysema and
pulmonary fibrosis.
2. Aortic Atherosclerosis (NOSMU-170.0)
3.  Emphysema. (NOSMU-U7O.1)
